# Patient Record
Sex: Male | Born: 1955
Health system: Southern US, Community
[De-identification: ages and names within clinical notes are randomized; demographics above are authoritative.]

## PROBLEM LIST (undated history)

## (undated) DIAGNOSIS — N529 Male erectile dysfunction, unspecified: Secondary | ICD-10-CM

## (undated) DIAGNOSIS — Z888 Allergy status to other drugs, medicaments and biological substances status: Secondary | ICD-10-CM

## (undated) DIAGNOSIS — J309 Allergic rhinitis, unspecified: Secondary | ICD-10-CM

## (undated) DIAGNOSIS — Z8679 Personal history of other diseases of the circulatory system: Secondary | ICD-10-CM

## (undated) DIAGNOSIS — I4891 Unspecified atrial fibrillation: Secondary | ICD-10-CM

## (undated) DIAGNOSIS — F419 Anxiety disorder, unspecified: Secondary | ICD-10-CM

## (undated) DIAGNOSIS — H9319 Tinnitus, unspecified ear: Secondary | ICD-10-CM

## (undated) DIAGNOSIS — R202 Paresthesia of skin: Secondary | ICD-10-CM

## (undated) DIAGNOSIS — F329 Major depressive disorder, single episode, unspecified: Secondary | ICD-10-CM

## (undated) DIAGNOSIS — E785 Hyperlipidemia, unspecified: Secondary | ICD-10-CM

## (undated) DIAGNOSIS — I428 Other cardiomyopathies: Secondary | ICD-10-CM

## (undated) DIAGNOSIS — R079 Chest pain, unspecified: Secondary | ICD-10-CM

## (undated) DIAGNOSIS — I10 Essential (primary) hypertension: Secondary | ICD-10-CM

## (undated) DIAGNOSIS — N200 Calculus of kidney: Secondary | ICD-10-CM

## (undated) DIAGNOSIS — J019 Acute sinusitis, unspecified: Secondary | ICD-10-CM

## (undated) HISTORY — DX: Male erectile dysfunction, unspecified: N52.9

## (undated) HISTORY — DX: Personal history of other diseases of the circulatory system: Z86.79

## (undated) HISTORY — DX: Anxiety disorder, unspecified: F41.9

## (undated) HISTORY — DX: Allergy status to other drugs, medicaments and biological substances status: Z88.8

## (undated) HISTORY — DX: Acute sinusitis, unspecified: J01.90

## (undated) HISTORY — DX: Allergic rhinitis, unspecified: J30.9

## (undated) HISTORY — DX: Other cardiomyopathies: I42.8

## (undated) HISTORY — DX: Tinnitus, unspecified ear: H93.19

## (undated) HISTORY — DX: Hyperlipidemia, unspecified: E78.5

## (undated) HISTORY — DX: Paresthesia of skin: R20.2

## (undated) HISTORY — DX: Calculus of kidney: N20.0

## (undated) HISTORY — DX: Essential (primary) hypertension: I10

## (undated) HISTORY — DX: Major depressive disorder, single episode, unspecified: F32.9

## (undated) HISTORY — DX: Chest pain, unspecified: R07.9

## (undated) HISTORY — DX: Unspecified atrial fibrillation: I48.91

---

## 2002-02-13 ENCOUNTER — Ambulatory Visit (HOSPITAL_COMMUNITY): Admission: RE | Admit: 2002-02-13 | Discharge: 2002-02-13 | Payer: Self-pay | Admitting: Chiropractor

## 2002-02-13 ENCOUNTER — Encounter: Payer: Self-pay | Admitting: Chiropractor

## 2004-05-27 ENCOUNTER — Ambulatory Visit (HOSPITAL_COMMUNITY): Admission: RE | Admit: 2004-05-27 | Discharge: 2004-05-27 | Payer: Self-pay | Admitting: Orthopedic Surgery

## 2004-05-27 ENCOUNTER — Ambulatory Visit (HOSPITAL_BASED_OUTPATIENT_CLINIC_OR_DEPARTMENT_OTHER): Admission: RE | Admit: 2004-05-27 | Discharge: 2004-05-27 | Payer: Self-pay | Admitting: Orthopedic Surgery

## 2004-05-27 ENCOUNTER — Encounter (INDEPENDENT_AMBULATORY_CARE_PROVIDER_SITE_OTHER): Payer: Self-pay | Admitting: *Deleted

## 2005-01-03 ENCOUNTER — Ambulatory Visit: Payer: Self-pay | Admitting: Family Medicine

## 2005-01-04 ENCOUNTER — Ambulatory Visit: Payer: Self-pay | Admitting: Family Medicine

## 2005-05-11 ENCOUNTER — Ambulatory Visit: Payer: Self-pay | Admitting: Family Medicine

## 2005-07-24 ENCOUNTER — Ambulatory Visit: Payer: Self-pay | Admitting: Family Medicine

## 2005-11-23 ENCOUNTER — Ambulatory Visit: Payer: Self-pay | Admitting: Family Medicine

## 2006-03-14 ENCOUNTER — Ambulatory Visit: Payer: Self-pay | Admitting: Family Medicine

## 2006-03-29 ENCOUNTER — Encounter: Admission: RE | Admit: 2006-03-29 | Discharge: 2006-03-29 | Payer: Self-pay | Admitting: *Deleted

## 2006-09-05 ENCOUNTER — Ambulatory Visit: Payer: Self-pay | Admitting: Family Medicine

## 2007-03-05 DIAGNOSIS — N200 Calculus of kidney: Secondary | ICD-10-CM | POA: Insufficient documentation

## 2007-03-05 DIAGNOSIS — E785 Hyperlipidemia, unspecified: Secondary | ICD-10-CM | POA: Insufficient documentation

## 2007-03-05 DIAGNOSIS — J309 Allergic rhinitis, unspecified: Secondary | ICD-10-CM

## 2007-03-05 HISTORY — DX: Calculus of kidney: N20.0

## 2007-03-05 HISTORY — DX: Hyperlipidemia, unspecified: E78.5

## 2007-03-05 HISTORY — DX: Allergic rhinitis, unspecified: J30.9

## 2007-04-25 ENCOUNTER — Telehealth: Payer: Self-pay | Admitting: Family Medicine

## 2007-04-25 ENCOUNTER — Ambulatory Visit: Payer: Self-pay | Admitting: Family Medicine

## 2007-04-25 DIAGNOSIS — F3289 Other specified depressive episodes: Secondary | ICD-10-CM

## 2007-04-25 DIAGNOSIS — F329 Major depressive disorder, single episode, unspecified: Secondary | ICD-10-CM | POA: Insufficient documentation

## 2007-04-25 DIAGNOSIS — Z8679 Personal history of other diseases of the circulatory system: Secondary | ICD-10-CM | POA: Insufficient documentation

## 2007-04-25 DIAGNOSIS — J019 Acute sinusitis, unspecified: Secondary | ICD-10-CM | POA: Insufficient documentation

## 2007-04-25 HISTORY — DX: Personal history of other diseases of the circulatory system: Z86.79

## 2007-04-25 HISTORY — DX: Acute sinusitis, unspecified: J01.90

## 2007-04-25 HISTORY — DX: Major depressive disorder, single episode, unspecified: F32.9

## 2007-04-25 HISTORY — DX: Other specified depressive episodes: F32.89

## 2007-04-26 ENCOUNTER — Telehealth (INDEPENDENT_AMBULATORY_CARE_PROVIDER_SITE_OTHER): Payer: Self-pay | Admitting: *Deleted

## 2007-04-26 ENCOUNTER — Ambulatory Visit: Payer: Self-pay | Admitting: Family Medicine

## 2007-04-27 ENCOUNTER — Ambulatory Visit: Payer: Self-pay | Admitting: Family Medicine

## 2007-04-27 ENCOUNTER — Encounter: Payer: Self-pay | Admitting: Family Medicine

## 2007-04-30 LAB — CONVERTED CEMR LAB
ALT: 36 units/L (ref 0–40)
AST: 23 units/L (ref 0–37)
Direct LDL: 135 mg/dL
HDL: 41.9 mg/dL (ref >39.0)
Testosterone Free: 111.5 pg/mL (ref 47.0–244.0)

## 2007-07-31 ENCOUNTER — Ambulatory Visit: Payer: Self-pay | Admitting: Family Medicine

## 2007-08-06 ENCOUNTER — Encounter (INDEPENDENT_AMBULATORY_CARE_PROVIDER_SITE_OTHER): Payer: Self-pay | Admitting: *Deleted

## 2007-08-06 LAB — CONVERTED CEMR LAB
ALT: 40 units/L (ref 0–53)
AST: 30 units/L (ref 0–37)
HDL: 39.9 mg/dL (ref >39.0)
Total Bilirubin: 0.9 mg/dL (ref 0.3–1.2)
Triglycerides: 184 mg/dL — ABNORMAL HIGH (ref 0–149)

## 2007-11-18 ENCOUNTER — Telehealth (INDEPENDENT_AMBULATORY_CARE_PROVIDER_SITE_OTHER): Payer: Self-pay | Admitting: *Deleted

## 2008-01-16 ENCOUNTER — Ambulatory Visit: Payer: Self-pay | Admitting: Family Medicine

## 2008-01-27 LAB — CONVERTED CEMR LAB
Alkaline Phosphatase: 61 units/L (ref 39–117)
Bilirubin, Direct: 0.1 mg/dL (ref 0.0–0.3)
Cholesterol: 175 mg/dL (ref 0–200)
Total Bilirubin: 0.9 mg/dL (ref 0.3–1.2)
Total CHOL/HDL Ratio: 3.8
Total Protein: 7.4 g/dL (ref 6.0–8.3)
Triglycerides: 89 mg/dL (ref 0–149)
VLDL: 18 mg/dL (ref 0–40)

## 2008-01-30 ENCOUNTER — Telehealth (INDEPENDENT_AMBULATORY_CARE_PROVIDER_SITE_OTHER): Payer: Self-pay | Admitting: *Deleted

## 2008-01-31 ENCOUNTER — Ambulatory Visit: Payer: Self-pay | Admitting: Family Medicine

## 2008-02-06 ENCOUNTER — Telehealth (INDEPENDENT_AMBULATORY_CARE_PROVIDER_SITE_OTHER): Payer: Self-pay | Admitting: *Deleted

## 2008-07-27 ENCOUNTER — Ambulatory Visit: Payer: Self-pay | Admitting: Family Medicine

## 2008-08-10 ENCOUNTER — Encounter (INDEPENDENT_AMBULATORY_CARE_PROVIDER_SITE_OTHER): Payer: Self-pay | Admitting: *Deleted

## 2008-08-10 LAB — CONVERTED CEMR LAB
ALT: 30 units/L (ref 0–53)
Albumin: 4.2 g/dL (ref 3.5–5.2)
Alkaline Phosphatase: 51 units/L (ref 39–117)
Cholesterol: 186 mg/dL (ref 0–200)
Total Bilirubin: 1.1 mg/dL (ref 0.3–1.2)
Triglycerides: 129 mg/dL (ref 0–149)

## 2008-10-01 ENCOUNTER — Ambulatory Visit: Payer: Self-pay | Admitting: Family Medicine

## 2008-10-01 DIAGNOSIS — I10 Essential (primary) hypertension: Secondary | ICD-10-CM

## 2008-10-01 DIAGNOSIS — R0789 Other chest pain: Secondary | ICD-10-CM | POA: Insufficient documentation

## 2008-10-01 DIAGNOSIS — R079 Chest pain, unspecified: Secondary | ICD-10-CM

## 2008-10-01 HISTORY — DX: Chest pain, unspecified: R07.9

## 2008-10-01 HISTORY — DX: Essential (primary) hypertension: I10

## 2008-10-02 ENCOUNTER — Encounter (INDEPENDENT_AMBULATORY_CARE_PROVIDER_SITE_OTHER): Payer: Self-pay | Admitting: *Deleted

## 2008-10-13 ENCOUNTER — Encounter: Payer: Self-pay | Admitting: Family Medicine

## 2008-10-14 ENCOUNTER — Ambulatory Visit: Payer: Self-pay

## 2008-10-15 ENCOUNTER — Ambulatory Visit: Payer: Self-pay | Admitting: Family Medicine

## 2008-10-22 ENCOUNTER — Encounter (INDEPENDENT_AMBULATORY_CARE_PROVIDER_SITE_OTHER): Payer: Self-pay | Admitting: *Deleted

## 2008-10-22 ENCOUNTER — Telehealth (INDEPENDENT_AMBULATORY_CARE_PROVIDER_SITE_OTHER): Payer: Self-pay | Admitting: *Deleted

## 2009-01-18 ENCOUNTER — Ambulatory Visit: Payer: Self-pay | Admitting: Family Medicine

## 2009-01-18 DIAGNOSIS — Z888 Allergy status to other drugs, medicaments and biological substances status: Secondary | ICD-10-CM

## 2009-01-18 HISTORY — DX: Allergy status to other drugs, medicaments and biological substances: Z88.8

## 2009-02-01 ENCOUNTER — Ambulatory Visit: Payer: Self-pay | Admitting: Family Medicine

## 2009-02-20 LAB — CONVERTED CEMR LAB
ALT: 27 units/L (ref 0–53)
CO2: 29 meq/L (ref 19–32)
Calcium: 9.2 mg/dL (ref 8.4–10.5)
Direct LDL: 193.9 mg/dL
GFR calc non Af Amer: 61.24 mL/min (ref >60)
Glucose, Bld: 99 mg/dL (ref 70–99)
Potassium: 4.3 meq/L (ref 3.5–5.1)
Total CHOL/HDL Ratio: 6
Triglycerides: 122 mg/dL (ref 0.0–149.0)
VLDL: 24.4 mg/dL (ref 0.0–40.0)

## 2009-02-23 ENCOUNTER — Telehealth: Payer: Self-pay | Admitting: Family Medicine

## 2010-02-27 ENCOUNTER — Ambulatory Visit: Payer: Self-pay | Admitting: Pulmonary Disease

## 2010-02-27 ENCOUNTER — Inpatient Hospital Stay (HOSPITAL_COMMUNITY): Admission: EM | Admit: 2010-02-27 | Discharge: 2010-03-22 | Payer: Self-pay | Admitting: Emergency Medicine

## 2010-02-28 ENCOUNTER — Ambulatory Visit: Payer: Self-pay | Admitting: Cardiology

## 2010-03-01 ENCOUNTER — Encounter: Payer: Self-pay | Admitting: Pulmonary Disease

## 2010-03-02 ENCOUNTER — Encounter: Payer: Self-pay | Admitting: Pulmonary Disease

## 2010-03-04 ENCOUNTER — Ambulatory Visit: Payer: Self-pay | Admitting: Internal Medicine

## 2010-03-07 ENCOUNTER — Encounter: Payer: Self-pay | Admitting: Critical Care Medicine

## 2010-03-07 ENCOUNTER — Encounter: Payer: Self-pay | Admitting: Cardiology

## 2010-03-14 ENCOUNTER — Encounter (INDEPENDENT_AMBULATORY_CARE_PROVIDER_SITE_OTHER): Payer: Self-pay | Admitting: Pulmonary Disease

## 2010-03-14 ENCOUNTER — Ambulatory Visit: Payer: Self-pay | Admitting: Vascular Surgery

## 2010-03-18 ENCOUNTER — Ambulatory Visit: Payer: Self-pay | Admitting: Physical Medicine & Rehabilitation

## 2010-03-22 ENCOUNTER — Inpatient Hospital Stay (HOSPITAL_COMMUNITY)
Admission: RE | Admit: 2010-03-22 | Discharge: 2010-03-30 | Payer: Self-pay | Admitting: Physical Medicine & Rehabilitation

## 2010-03-26 ENCOUNTER — Ambulatory Visit: Payer: Self-pay | Admitting: Physical Medicine & Rehabilitation

## 2010-03-30 ENCOUNTER — Encounter: Payer: Self-pay | Admitting: Physical Medicine & Rehabilitation

## 2010-04-04 ENCOUNTER — Encounter
Admission: RE | Admit: 2010-04-04 | Discharge: 2010-04-18 | Payer: Self-pay | Admitting: Physical Medicine & Rehabilitation

## 2010-04-12 ENCOUNTER — Encounter
Admission: RE | Admit: 2010-04-12 | Discharge: 2010-04-18 | Payer: Self-pay | Admitting: Physical Medicine & Rehabilitation

## 2010-04-28 ENCOUNTER — Encounter: Payer: Self-pay | Admitting: Cardiology

## 2010-04-28 DIAGNOSIS — I4891 Unspecified atrial fibrillation: Secondary | ICD-10-CM

## 2010-04-28 HISTORY — DX: Unspecified atrial fibrillation: I48.91

## 2010-04-29 ENCOUNTER — Ambulatory Visit: Payer: Self-pay | Admitting: Cardiology

## 2010-07-29 ENCOUNTER — Ambulatory Visit: Payer: Self-pay | Admitting: Cardiology

## 2010-07-29 DIAGNOSIS — I428 Other cardiomyopathies: Secondary | ICD-10-CM

## 2010-07-29 HISTORY — DX: Other cardiomyopathies: I42.8

## 2010-08-08 ENCOUNTER — Encounter: Payer: Self-pay | Admitting: Family Medicine

## 2010-08-30 ENCOUNTER — Encounter: Payer: Self-pay | Admitting: Internal Medicine

## 2010-08-30 ENCOUNTER — Encounter: Payer: Self-pay | Admitting: Cardiology

## 2010-08-30 ENCOUNTER — Ambulatory Visit: Payer: Self-pay

## 2010-08-30 ENCOUNTER — Ambulatory Visit: Payer: Self-pay | Admitting: Cardiology

## 2010-08-30 ENCOUNTER — Ambulatory Visit (HOSPITAL_COMMUNITY): Admission: RE | Admit: 2010-08-30 | Discharge: 2010-08-30 | Payer: Self-pay | Admitting: Cardiology

## 2010-10-01 ENCOUNTER — Ambulatory Visit: Payer: Self-pay | Admitting: Oncology

## 2010-10-02 ENCOUNTER — Inpatient Hospital Stay (HOSPITAL_COMMUNITY): Admission: EM | Admit: 2010-10-02 | Discharge: 2010-10-04 | Payer: Self-pay | Admitting: Emergency Medicine

## 2010-10-04 ENCOUNTER — Ambulatory Visit: Payer: Self-pay | Admitting: Oncology

## 2010-10-06 ENCOUNTER — Emergency Department (HOSPITAL_COMMUNITY): Admission: EM | Admit: 2010-10-06 | Discharge: 2010-10-06 | Payer: Self-pay | Admitting: Emergency Medicine

## 2010-10-07 ENCOUNTER — Telehealth: Payer: Self-pay | Admitting: Cardiology

## 2010-10-13 ENCOUNTER — Encounter: Payer: Self-pay | Admitting: Cardiology

## 2010-10-13 LAB — CBC WITH DIFFERENTIAL/PLATELET
BASO%: 0.4 % (ref 0.0–2.0)
Basophils Absolute: 0 10*3/uL (ref 0.0–0.1)
EOS%: 0.7 % (ref 0.0–7.0)
Eosinophils Absolute: 0 10*3/uL (ref 0.0–0.5)
LYMPH%: 38.2 % (ref 14.0–49.0)
MCH: 33.8 pg — ABNORMAL HIGH (ref 27.2–33.4)
MCHC: 34.7 g/dL (ref 32.0–36.0)
MONO#: 0.5 10*3/uL (ref 0.1–0.9)
MONO%: 6.9 % (ref 0.0–14.0)
NEUT%: 53.8 % (ref 39.0–75.0)
WBC: 6.8 10*3/uL (ref 4.0–10.3)

## 2010-10-20 ENCOUNTER — Encounter: Payer: Self-pay | Admitting: Cardiology

## 2010-10-20 LAB — CBC WITH DIFFERENTIAL/PLATELET
Basophils Absolute: 0 10*3/uL (ref 0.0–0.1)
MCHC: 34 g/dL (ref 32.0–36.0)
MONO#: 0.5 10*3/uL (ref 0.1–0.9)
MONO%: 13.6 % (ref 0.0–14.0)
NEUT#: 1.8 10*3/uL (ref 1.5–6.5)
RBC: 3.36 10*6/uL — ABNORMAL LOW (ref 4.20–5.82)
WBC: 3.8 10*3/uL — ABNORMAL LOW (ref 4.0–10.3)
nRBC: 1 % — ABNORMAL HIGH (ref 0–0)

## 2010-10-24 LAB — CBC WITH DIFFERENTIAL/PLATELET
Basophils Absolute: 0 10*3/uL (ref 0.0–0.1)
Eosinophils Absolute: 0.1 10*3/uL (ref 0.0–0.5)
HCT: 35.1 % — ABNORMAL LOW (ref 38.4–49.9)
HGB: 12 g/dL — ABNORMAL LOW (ref 13.0–17.1)
LYMPH%: 49.9 % — ABNORMAL HIGH (ref 14.0–49.0)
MCV: 97.2 fL (ref 79.3–98.0)
MONO#: 0.2 10*3/uL (ref 0.1–0.9)
MONO%: 6 % (ref 0.0–14.0)
NEUT#: 1.5 10*3/uL (ref 1.5–6.5)
Platelets: 120 10*3/uL — ABNORMAL LOW (ref 140–400)
RBC: 3.61 10*6/uL — ABNORMAL LOW (ref 4.20–5.82)
WBC: 3.5 10*3/uL — ABNORMAL LOW (ref 4.0–10.3)

## 2010-11-03 ENCOUNTER — Ambulatory Visit: Payer: Self-pay | Admitting: Oncology

## 2010-11-08 ENCOUNTER — Encounter (HOSPITAL_COMMUNITY)
Admission: RE | Admit: 2010-11-08 | Discharge: 2010-11-11 | Payer: Self-pay | Source: Home / Self Care | Attending: Oncology | Admitting: Oncology

## 2010-11-08 LAB — BASIC METABOLIC PANEL
CO2: 24 mEq/L (ref 19–32)
Calcium: 9.1 mg/dL (ref 8.4–10.5)
Creatinine, Ser: 1.21 mg/dL (ref 0.40–1.50)
Glucose, Bld: 97 mg/dL (ref 70–99)

## 2010-11-08 LAB — CBC WITH DIFFERENTIAL/PLATELET
BASO%: 0.6 % (ref 0.0–2.0)
Eosinophils Absolute: 0.1 10*3/uL (ref 0.0–0.5)
HCT: 38.7 % (ref 38.4–49.9)
LYMPH%: 41.6 % (ref 14.0–49.0)
MCHC: 35.5 g/dL (ref 32.0–36.0)
MCV: 95.4 fL (ref 79.3–98.0)
MONO#: 0.2 10*3/uL (ref 0.1–0.9)
MONO%: 5.5 % (ref 0.0–14.0)
NEUT%: 50.8 % (ref 39.0–75.0)
Platelets: 72 10*3/uL — ABNORMAL LOW (ref 140–400)
RBC: 4.06 10*6/uL — ABNORMAL LOW (ref 4.20–5.82)
WBC: 4.3 10*3/uL (ref 4.0–10.3)

## 2010-11-09 ENCOUNTER — Ambulatory Visit
Admission: RE | Admit: 2010-11-09 | Discharge: 2010-11-09 | Payer: Self-pay | Source: Home / Self Care | Attending: Orthopedic Surgery | Admitting: Orthopedic Surgery

## 2010-11-16 ENCOUNTER — Encounter: Payer: Self-pay | Admitting: Cardiology

## 2010-11-17 LAB — CBC WITH DIFFERENTIAL/PLATELET
BASO%: 0.5 % (ref 0.0–2.0)
Basophils Absolute: 0 10*3/uL (ref 0.0–0.1)
EOS%: 1.2 % (ref 0.0–7.0)
Eosinophils Absolute: 0.1 10*3/uL (ref 0.0–0.5)
HCT: 40.9 % (ref 38.4–49.9)
HGB: 14.5 g/dL (ref 13.0–17.1)
LYMPH%: 32.3 % (ref 14.0–49.0)
MCH: 32.4 pg (ref 27.2–33.4)
MCHC: 35.5 g/dL (ref 32.0–36.0)
MCV: 91.3 fL (ref 79.3–98.0)
MONO#: 0.6 10*3/uL (ref 0.1–0.9)
MONO%: 10.1 % (ref 0.0–14.0)
NEUT#: 3.2 10*3/uL (ref 1.5–6.5)
NEUT%: 55.9 % (ref 39.0–75.0)
Platelets: 67 10*3/uL — ABNORMAL LOW (ref 140–400)
RBC: 4.48 10*6/uL (ref 4.20–5.82)
RDW: 13 % (ref 11.0–14.6)
WBC: 5.8 10*3/uL (ref 4.0–10.3)
lymph#: 1.9 10*3/uL (ref 0.9–3.3)
nRBC: 0 % (ref 0–0)

## 2010-11-25 LAB — CBC WITH DIFFERENTIAL/PLATELET
BASO%: 0.3 % (ref 0.0–2.0)
Basophils Absolute: 0 10*3/uL (ref 0.0–0.1)
EOS%: 1 % (ref 0.0–7.0)
Eosinophils Absolute: 0.1 10*3/uL (ref 0.0–0.5)
HCT: 42.3 % (ref 38.4–49.9)
HGB: 15 g/dL (ref 13.0–17.1)
LYMPH%: 42.8 % (ref 14.0–49.0)
MCH: 32.5 pg (ref 27.2–33.4)
MCHC: 35.5 g/dL (ref 32.0–36.0)
MCV: 91.6 fL (ref 79.3–98.0)
MONO#: 0.5 10*3/uL (ref 0.1–0.9)
MONO%: 7.9 % (ref 0.0–14.0)
NEUT#: 3 10*3/uL (ref 1.5–6.5)
NEUT%: 48 % (ref 39.0–75.0)
Platelets: 122 10*3/uL — ABNORMAL LOW (ref 140–400)
RBC: 4.62 10*6/uL (ref 4.20–5.82)
RDW: 12.8 % (ref 11.0–14.6)
WBC: 6.2 10*3/uL (ref 4.0–10.3)
lymph#: 2.7 10*3/uL (ref 0.9–3.3)

## 2010-12-02 LAB — CBC WITH DIFFERENTIAL/PLATELET
BASO%: 0.4 % (ref 0.0–2.0)
Basophils Absolute: 0 10*3/uL (ref 0.0–0.1)
EOS%: 1.3 % (ref 0.0–7.0)
Eosinophils Absolute: 0.1 10*3/uL (ref 0.0–0.5)
HCT: 42.5 % (ref 38.4–49.9)
HGB: 14.8 g/dL (ref 13.0–17.1)
LYMPH%: 32.9 % (ref 14.0–49.0)
MCH: 31.9 pg (ref 27.2–33.4)
MCHC: 34.8 g/dL (ref 32.0–36.0)
MCV: 91.6 fL (ref 79.3–98.0)
MONO#: 0.4 10*3/uL (ref 0.1–0.9)
MONO%: 7.3 % (ref 0.0–14.0)
NEUT#: 3.2 10*3/uL (ref 1.5–6.5)
NEUT%: 58.1 % (ref 39.0–75.0)
Platelets: 81 10*3/uL — ABNORMAL LOW (ref 140–400)
RBC: 4.64 10*6/uL (ref 4.20–5.82)
RDW: 12.6 % (ref 11.0–14.6)
WBC: 5.5 10*3/uL (ref 4.0–10.3)
lymph#: 1.8 10*3/uL (ref 0.9–3.3)

## 2010-12-07 ENCOUNTER — Ambulatory Visit (HOSPITAL_BASED_OUTPATIENT_CLINIC_OR_DEPARTMENT_OTHER): Payer: BC Managed Care – PPO | Admitting: Oncology

## 2010-12-08 NOTE — Op Note (Addendum)
  [name], Richard Scott                ACCOUNT NO.:  [id number]  MEDICAL RECORD NO.:  [id number]          PATIENT TYPE:  AMB  LOCATION:  DSC                          FACILITY:  [facility]  PHYSICIAN:  Artist Pais. Weingold, M.D.DATE OF BIRTH:  03-09-1956  DATE OF PROCEDURE: DATE OF DISCHARGE:                              OPERATIVE REPORT   PREOPERATIVE DIAGNOSIS:  Web space contracture to the left hand, thumb index finger.  POSTOPERATIVE DIAGNOSIS:  Web space contracture to the left hand, thumb index finger.  PROCEDURE:  Release of contracted first dorsal interosseous muscle and abductor muscle, webspace thumb index left hand with rotational flap and full-thickness skin grafting.  SURGEON:  Artist Pais. Mina Marble, MD. Dr. Merlyn Lot  ANESTHESIA:  General.  TOURNIQUET TIME:  50 minutes.  COMPLICATIONS:  No complication.  DRAINS:  No drains.  OPERATIVE REPORT:  The patient was taken to the operating suite.  After induction of adequate general anesthesia, left upper extremity is prepped and draped in sterile fashion.  An Esmarch was used to exsanguinate the limb.  Tourniquet was then inflated to 215 mmHg.  At this point in time, incision was made in the webspace of the left hand starting at the mid metacarpal level both the index and thumb going into the webspace longitudinally.  Care was taken to identify and retract neurovascular bundles.  The contracted first dorsal interosseous muscle and thumb abductor were carefully released off the index metacarpal. The webspace was deepened and increased by this contracture release.  At this point in time, a rotational flap was developed from the index finger dorsally, raised to fill the defect and sutured using 4-0 Vicryl Rapide sutures.  After this was done, a full-thickness skin graft was then obtained from the palmar aspect of the hand and the wrist area on the left side, elliptical nature.  This was raised and then used to fill the defect with  rotational flap was initiated.  This was sewn with 4-0 Vicryl Rapide at the tips of the flap followed by running 6-0 chromic suture.  The donor site was closed with 4-0 Vicryl Rapide horizontal mattress sutures x4.  All injection sites were carefully infiltrated with course on plain Marcaine.  The patient was placed in sterile dressing. Xeroform, 4x4s fluffs and a splint with the thumb web space maximally abducted.  The patient tolerated the procedure well and went to recovery room in stable fashion.     Artist Pais Mina Marble, M.D.     MAW/MEDQ  D:  11/09/2010  T:  11/10/2010  Job:  [id number]  Electronically Signed by Dairl Ponder M.D. on 12/08/2010 07:47:27 PM

## 2010-12-09 LAB — CBC WITH DIFFERENTIAL/PLATELET
BASO%: 0.4 % (ref 0.0–2.0)
EOS%: 1.8 % (ref 0.0–7.0)
HCT: 40.2 % (ref 38.4–49.9)
HGB: 14.2 g/dL (ref 13.0–17.1)
MCH: 32.6 pg (ref 27.2–33.4)
MCHC: 35.4 g/dL (ref 32.0–36.0)
MONO%: 6.1 % (ref 0.0–14.0)
NEUT#: 2.7 10*3/uL (ref 1.5–6.5)
Platelets: 76 10*3/uL — ABNORMAL LOW (ref 140–400)
RBC: 4.36 10*6/uL (ref 4.20–5.82)
RDW: 13.1 % (ref 11.0–14.6)
WBC: 4.6 10*3/uL (ref 4.0–10.3)

## 2010-12-11 LAB — CONVERTED CEMR LAB
ALT: 36 units/L (ref 0–53)
Albumin: 4.1 g/dL (ref 3.5–5.2)
Alkaline Phosphatase: 47 units/L (ref 39–117)
Basophils Absolute: 0 10*3/uL (ref 0.0–0.1)
Basophils Relative: 0.1 % (ref 0.0–3.0)
CK-MB: 1.7 ng/mL (ref 0.3–4.0)
CO2: 28 meq/L (ref 19–32)
Chloride: 104 meq/L (ref 96–112)
Direct LDL: 94.9 mg/dL
Eosinophils Absolute: 0.1 10*3/uL (ref 0.0–0.7)
Eosinophils Relative: 1.9 % (ref 0.0–5.0)
GFR calc Af Amer: 81 mL/min
HDL: 52.7 mg/dL (ref >39.0)
Lymphocytes Relative: 38.2 % (ref 12.0–46.0)
Monocytes Relative: 3.3 % (ref 3.0–12.0)
Platelets: 161 10*3/uL (ref 150–400)
RDW: 12 % (ref 11.5–14.6)
Total Bilirubin: 0.7 mg/dL (ref 0.3–1.2)
Total CHOL/HDL Ratio: 3.5
Total CK: 73 units/L (ref 7–195)
Triglycerides: 270 mg/dL (ref 0–149)
WBC: 6 10*3/uL (ref 4.5–10.5)

## 2010-12-12 ENCOUNTER — Encounter: Payer: Self-pay | Admitting: Cardiology

## 2010-12-12 LAB — CBC WITH DIFFERENTIAL/PLATELET
Basophils Absolute: 0 10*3/uL (ref 0.0–0.1)
EOS%: 0.5 % (ref 0.0–7.0)
Eosinophils Absolute: 0 10*3/uL (ref 0.0–0.5)
HCT: 40.5 % (ref 38.4–49.9)
MONO#: 0.6 10*3/uL (ref 0.1–0.9)
MONO%: 10.7 % (ref 0.0–14.0)
NEUT#: 2.8 10*3/uL (ref 1.5–6.5)
NEUT%: 49 % (ref 39.0–75.0)
Platelets: 111 10*3/uL — ABNORMAL LOW (ref 140–400)
RBC: 4.48 10*6/uL (ref 4.20–5.82)
RDW: 12.5 % (ref 11.0–14.6)
WBC: 5.6 10*3/uL (ref 4.0–10.3)
lymph#: 2.2 10*3/uL (ref 0.9–3.3)

## 2010-12-13 NOTE — Progress Notes (Signed)
Summary: feeling lightheaded and passed out yesterday   Phone Note Call from Patient Call back at Home Phone [phone]   Caller: Patient Summary of Call: Pt feeling light headed passed out twice yesterday Initial call taken by: Judie Grieve,  October 07, 2010 12:38 PM  Follow-up for Phone Call        10/07/10--1300pm--pt. calling c/o being liteheaded since d/c from [facility]--states passed out in kitchen last noc--BP=123/73 this am --pulse 92 and seems regular(per wife)--pt has history of thrombocytopenia and has f/u appoint with dr Trish Fountain speak with dr Myrtis Ser and phone pt back--pt agrees--nt 1310--10/07/10--spoke with dr Myrtis Ser who states pt should go to ed to have blood work done 1315--10/07/10--returned call to pt and advised him to go to [place] for lab work(per dr Myrtis Ser) --pt states he does not want to go back to [facility] and feels his symptoms are due to IVIG-he states he will f/u with dr Darnelle Catalan this coming week--nt Follow-up by: Ledon Snare, RN,  October 07, 2010 1:27 PM

## 2010-12-13 NOTE — Miscellaneous (Signed)
  Clinical Lists Changes  Problems: Added new problem of * SEPSIS /RENAL FAILURE /RESPIRATORY FAILURE / APRIL, 2011 Added new problem of ATRIAL FIBRILLATION, PAROXYSMAL (ICD-427.31) Observations: Added new observation of PAST MED HX: Allergic rhinitis Hyperlipidemia Hypertension Septic shock/ pneumonia / vent-dependent respiratory failure / multiorgan dysfunction with renal failure / M[facility].Marland Kitchen February 28, 2010-Mar 22, 2010. Troponin elevation with LV dysfunction... at the time of septic shock... April, 2011... septic cardiomyopathy and paroxysmal atrial fib... improved during hospitalization Ischemic left thumb... at time of septic shock EF 40%... echo... sepsis syndrome... March 01, 2010  /  EF 60%... echo... 25th, 2011... sepsis improved... LV improved... no focal wall motion abnormalities Atrial fibrillation...mch... April, 2011.... limited to septic syndrome (04/28/2010 10:39) Added new observation of PRIMARY MD: lowne (04/28/2010 10:39)       Past History:  Past Medical History: Allergic rhinitis Hyperlipidemia Hypertension Septic shock/ pneumonia / vent-dependent respiratory failure / multiorgan dysfunction with renal failure / [facility].Marland Kitchen February 28, 2010-Mar 22, 2010. Troponin elevation with LV dysfunction... at the time of septic shock... April, 2011... septic cardiomyopathy and paroxysmal atrial fib... improved during hospitalization Ischemic left thumb... at time of septic shock EF 40%... echo... sepsis syndrome... March 01, 2010  /  EF 60%... echo... 25th, 2011... sepsis improved... LV improved... no focal wall motion abnormalities Atrial fibrillation...mch... April, 2011.... limited to septic syndrome

## 2010-12-13 NOTE — Miscellaneous (Signed)
Summary: Appointment Canceled  Appointment status changed to canceled by [employer] on 08/08/2010 4:41 PM.  Cancellation Comments --------------------- stress echo/427.31/bcbs prec. req/saf  Appointment Information ----------------------- Appt Type:  CARDIOLOGY ANCILLARY VISIT      Date:  Tuesday, August 16, 2010      Time:  2:00 PM for 60 min   Urgency:  Routine   Made By:  Pearson Grippe  To Visit:  [id number]    Reason:  stress echo/427.31/bcbs prec. req/saf  Appt Comments ------------- -- 08/08/10 16:41: ([facility]) CANCELED -- stress echo/427.31/bcbs prec. req/saf -- 07/29/10 9:35: ([facility]) BOOKED -- Routine CARDIOLOGY ANCILLARY VISIT at 08/16/2010 2:00 PM for 60 min stress echo/427.31/bcbs prec. req/saf

## 2010-12-13 NOTE — Assessment & Plan Note (Signed)
Summary: 3 month rov.sl  Medications Added SIMVASTATIN 80 MG TABS (SIMVASTATIN) Take one tablet by mouth daily at bedtime TRAMADOL HCL 50 MG TABS (TRAMADOL HCL) as needed METOPROLOL TARTRATE 25 MG TABS (METOPROLOL TARTRATE) Take one tablet by mouth twice a day        Visit Type:  Follow-up Primary Provider:  Abe People, MD  CC:  cardiac followup.  History of Present Illness: The patient is seen for cardiology followup.  I saw him last in the office April 29, 2010.  In that note I outlined the fact that the patient had a prolonged hospitalization during which time he was critically ill in April, 2011.  He had a sepsis syndrome and some left ventricular dysfunction at that time.  LV function improved.  He's done well.  He has gained some weight.  He's not having chest pain or shortness of breath.  He's had no palpitations.  Current Medications (verified): 1)  Adult Aspirin Low Strength 81 Mg  Tbdp (Aspirin) .Marland Kitchen.. 1qd 2)  Glucosamine Complex   Tabs (Nutritional Supplements) .Marland Kitchen.. 1 Once Daily 3)  Tricor 145 Mg Tabs (Fenofibrate) .Marland Kitchen.. 1 By Mouth Once Daily 4)  Simvastatin 80 Mg Tabs (Simvastatin) .... Take One Tablet By Mouth Daily At Bedtime 5)  Tramadol Hcl 50 Mg Tabs (Tramadol Hcl) .... As Needed 6)  Metoprolol Tartrate 25 Mg Tabs (Metoprolol Tartrate) .... Take One Tablet By Mouth Twice A Day  Allergies: 1)  ! Morphine 2)  ! Darvon 3)  ! Codeine 4)  ! Demerol 5)  ! * Tetanus 6)  ! * Sulfa Drugs  Past History:  Past Medical History: Hyperlipidemia  Hypertensio  Ischemic left thumb with planned debridement per Dr. Mina Marble on   Mar 30, 2010.  Acute renal failure, resolved.   Atrial fibrillation.   Dysphagia.  Anemia.  Septic shock/ pneumonia / vent-dependent respiratory failure / multiorgan dysfunction with renal failure / [facility].Marland Kitchen February 28, 2010-Mar 22, 2010. Troponin elevation with LV dysfunction... at the time of septic shock... April, 2011... septic cardiomyopathy  and  EF 40%... echo... sepsis syndrome... March 01, 2010  /  EF 60%... echo... 25th, 2011... sepsis improved... LV improved... no focal wall motion abnormalities Allergic rhinitis weight gain    September, 2011  Review of Systems       The patient denies fever, chills, headache, sweats, rash, change in vision, change in hearing, chest pain, cough, nausea vomiting, urinary symptoms.  He mentions some mild areas of skin pigmentation.  All of the systems are reviewed and are negative.  Vital Signs:  Patient profile:   55 year old male Height:      72 inches Weight:      210 pounds BMI:     28.58 Pulse rate:   90 / minute Resp:     14 per minute BP sitting:   118 / 80  (left arm)  Vitals Entered By: Kem Parkinson (July 29, 2010 8:56 AM)  Physical Exam  General:  The patient is stable today.  He has gained weight. Head:  head is atraumatic. Eyes:  no xanthelasma. Neck:  no jugular venous distention. Chest Wall:  no chest wall tenderness. Lungs:  lungs are clear.  Respiratory effort is unlabored. Heart:  cardiac exam reveals an S1-S2.  There are no clicks or significant murmurs. Abdomen:  abdomen is soft. Msk:  the patient has amputation of the tip of his left thumb and it continues to heal slowly. Extremities:  no peripheral  edema. Skin:  there are mild areas of increased redness in his skin.  This can be followed.  There is no sign of infection. Psych:  patient is oriented to person, place.  Affect is normal.   Impression & Recommendations:  Problem # 1:  * AREAS OF SKIN INCREASED REDNESS This has not ecchymoses.  This may be residual skin changes from his illness.  I do not feel that he needs definite workup at this time.  Over time he may want to follow with dermatology.  Problem # 2:  * WEIGHT GAIN I talk with him about his weight gain.  He'll begin to watch his diet better.  Problem # 3:  ATRIAL FIBRILLATION, PAROXYSMAL (ICD-427.31)  His updated medication list  for this problem includes:    Adult Aspirin Low Strength 81 Mg Tbdp (Aspirin) .Marland Kitchen... 1qd    Metoprolol Tartrate 25 Mg Tabs (Metoprolol tartrate) .Marland Kitchen... Take one tablet by mouth twice a day The patient had transient atrial fibrillation.  This occurred while he was critically ill.  His rhythm currently is regular.  Orders: Stress Echo (Stress Echo)  Problem # 4:  HYPERTENSION (ICD-401.9)  The following medications were removed from the medication list:    Lisinopril-hydrochlorothiazide 10-12.5 Mg Tabs (Lisinopril-hydrochlorothiazide) .Marland Kitchen... 1 by mouth once daily*office visit due now ** His updated medication list for this problem includes:    Adult Aspirin Low Strength 81 Mg Tbdp (Aspirin) .Marland Kitchen... 1qd    Metoprolol Tartrate 25 Mg Tabs (Metoprolol tartrate) .Marland Kitchen... Take one tablet by mouth twice a day Blood pressure control.  No change in therapy.  Problem # 5:  * TRANSIENT DECREASED LV FUNCTION WHEN SEPTIC We know that the patient's LV function returned to normal.  He did have some enzyme elevation.  It is now time to proceed with a stress echo to be sure that there is no evidence of ischemic disease.  As we arranged.  I will be in contact with him about the results.  If there are no significant abnormalities I plan to follow him up in one year.  Patient Instructions: 1)  Your physician has requested that you have a stress echocardiogram. For further information please visit [email or web address]  Please follow instruction sheet as given. 2)  Your physician wants you to follow-up in:  1 year.  You will receive a reminder letter in the mail two months in advance. If you don't receive a letter, please call our office to schedule the follow-up appointment.

## 2010-12-13 NOTE — Miscellaneous (Signed)
Summary: Appointment Canceled  Appointment status changed to canceled by [employer] on 08/08/2010 4:41 PM.  Cancellation Comments --------------------- c60/stress echo with joyce/saf  Appointment Information ----------------------- Appt Type:  EMR NO DOCUMENT      Date:  Tuesday, August 16, 2010      Time:  2:00 PM for 30 min   Urgency:  Routine   Made By:  Pearson Grippe  To Visit:  [id number]    Reason:  c60/stress echo with joyce/saf  Appt Comments ------------- -- 08/08/10 16:41: ([facility]) CANCELED -- c60/stress echo with joyce/saf -- 07/29/10 10:22: ([facility]) BOOKED -- Routine EMR NO DOCUMENT at 08/16/2010 2:00 PM for 30 min c60/stress echo with joyce/saf

## 2010-12-13 NOTE — Assessment & Plan Note (Signed)
Summary: eph/afib/renal failure/anemia/dysphagia/ok per heather/jml   Visit Type:  post hospital Primary Provider:  Abe People, MD   History of Present Illness: The patient is seen post hospitalization February 28, 2010 to Mar 22, 2010.  The patient had a septic syndrome with pneumonia.  He had vent dependent respiratory failure.  He was critically ill and has done extremely well.  He did have a brief run of atrial fibrillation when extremely ill.  This reverted to sinus rhythm.  Also, very early in his illness, he had mild troponin elevation and an echo showing some left ventricular dysfunction.  It was felt that this was due to his septic state.  Followup echo 6 days later showed return of normal function.  The patient had left ventricular dysfunction that was transient related to his septic episode.  There is no evidence that he has coronary artery disease.  Today he returns and he looks remarkably good.  He has continued healing in the left, where he required a small amount of surgery.  He is returned to work.  It is mildly weakened overall due to deconditioning but he is getting stronger.  He does not have chest pain.  There is no shortness of breath.  Current Medications (verified): 1)  Adult Aspirin Low Strength 81 Mg  Tbdp (Aspirin) .Marland Kitchen.. 1qd 2)  Glucosamine Complex   Tabs (Nutritional Supplements) .Marland Kitchen.. 1 Once Daily 3)  Claritin   Tabs (Loratadine Tabs) 4)  Zoloft 50 Mg  Tabs (Sertraline Hcl) .... Take One Half Tablet For One Week and Then One Tablet Daily 5)  Lisinopril-Hydrochlorothiazide 10-12.5 Mg Tabs (Lisinopril-Hydrochlorothiazide) .Marland Kitchen.. 1 By Mouth Once Daily*office Visit Due Now ** 6)  Tricor 145 Mg Tabs (Fenofibrate) .Marland Kitchen.. 1 By Mouth Once Daily 7)  Zocor 40 Mg Tabs (Simvastatin) .... Take One Tablet Each Evening At Bedtime.  Allergies: 1)  ! Morphine 2)  ! Darvon 3)  ! Codeine 4)  ! Demerol 5)  ! * Tetanus 6)  ! * Sulfa Drugs  Past History:  Past Surgical  History: Last updated: 04/28/2010  Debridement above with volar advancement flap.   Family History: Last updated: 04/28/2010 no cardiac hx in family  Social History: Last updated: 04/28/2010   The patient is married.  Wife is a Engineer, site.  The   patient works as a Hydrologist.  They have a two-level house  with bedroom downstairs.  Family plans to assist as needed.   The patient does not smoke, drink or do illicit drugs.   Past Medical History: Hyperlipidemia  Hypertensio  Ischemic left thumb with planned debridement per Dr. Mina Marble on   Mar 30, 2010.  Acute renal failure, resolved.   Atrial fibrillation.   Dysphagia.  Anemia.  Septic shock/ pneumonia / vent-dependent respiratory failure / multiorgan dysfunction with renal failure / [facility].Marland Kitchen February 28, 2010-Mar 22, 2010. Troponin elevation with LV dysfunction... at the time of septic shock... April, 2011... septic cardiomyopathy and  EF 40%... echo... sepsis syndrome... March 01, 2010  /  EF 60%... echo... 25th, 2011... sepsis improved... LV improved... no focal wall motion abnormalities Allergic rhinitis  Review of Systems       Patient denies fever, chills, headache, sweats, rash, change in vision, change in hearing, chest pain, cough, nausea vomiting, urinary symptoms.  All other systems are reviewed and are negative  Vital Signs:  Patient profile:   55 year old male Height:      72 inches Weight:  196 pounds BMI:     26.68 Pulse rate:   91 / minute Pulse rhythm:   regular BP sitting:   130 / 74  (left arm) Cuff size:   regular  Vitals Entered By: Stanton Kidney, EMT-P (April 29, 2010 8:55 AM)  Physical Exam  General:  patient looks great. Head:  head is atraumatic. Eyes:  no xanthelasma. Neck:  no jugular venous distention. Chest Wall:  no chest wall tenderness no chest wall tenderness. Lungs:  lungs are clear.  Respiratory effort is nonlabored. Heart:  cardiac exam reveals S1-S2.  No clicks or  significant murmurs. Abdomen:  abdomen is soft. Msk:  patient has a bandage on his left thumb Extremities:  no peripheral edema Skin:  no skin rashes. Psych:  patient is oriented to person time and place.  Affect is normal.  He is here with his wife today.   Impression & Recommendations:  Problem # 1:  ATRIAL FIBRILLATION, PAROXYSMAL (ICD-427.31)  The patient had a brief of atrial fibrillation while he was extremely ill with his sepsis syndrome.  I this reverted spontaneously to sinus rhythm.  He's had no further symptoms.  EKG is done today and reviewed by me.  He has normal sinus rhythm.  No further cardiac workup is needed for atrial fib.  Orders: EKG w/ Interpretation (93000)  Problem # 2:  * SEPSIS Judeth Porch FAILURE /RESPIRATORY FAILURE / APRIL, 2011 The patient has had a remarkable recovery from his episo  I willI have reviewed his echo isde of sepsis.  He was critically ill and he is greatly improved.  During his evaluation today I have reviewed multiple pages of hospital records.I have reviewed his echocardiogram reports and his lab reports.  Problem # 3:  HYPERTENSION (ICD-401.9)  His updated medication list for this problem includes:    Adult Aspirin Low Strength 81 Mg Tbdp (Aspirin) .Marland Kitchen... 1qd    Lisinopril-hydrochlorothiazide 10-12.5 Mg Tabs (Lisinopril-hydrochlorothiazide) .Marland Kitchen... 1 by mouth once daily*office visit due now ** Blood pressure is well controlled today.  No change in therapy.  Problem # 4:  * TRANSIENT DECREASED LV FUNCTION WHEN SEPTIC As outlined in the history of present illness the patient had transient decreased LV function at the time of his sepsis.  This improved completely.  There was slight enzyme elevation at that time.  There is no proof of myocardial infarction.  The patient is quite stable now.  EKG done today and reviewed by me.  He has mild nonspecific ST-T wave changes.  I will plan to see him back in 3 months.  At that time we will consider a stress  echo study to complete all cardiac evaluation.  Patient Instructions: 1)  Follow up in 3 months

## 2010-12-15 NOTE — Letter (Signed)
Summary: [facility]  [facility]   Imported By: Sherian Rein 10/28/2010 11:19:35  _____________________________________________________________________  External Attachment:    Type:   Image     Comment:   External Document

## 2010-12-15 NOTE — Letter (Signed)
Summary: [facility]  [facility]   Imported By: Sherian Rein 11/01/2010 14:01:40  _____________________________________________________________________  External Attachment:    Type:   Image     Comment:   External Document

## 2010-12-16 ENCOUNTER — Ambulatory Visit (HOSPITAL_BASED_OUTPATIENT_CLINIC_OR_DEPARTMENT_OTHER): Payer: BC Managed Care – PPO | Admitting: Oncology

## 2010-12-16 DIAGNOSIS — D693 Immune thrombocytopenic purpura: Secondary | ICD-10-CM

## 2010-12-16 DIAGNOSIS — D696 Thrombocytopenia, unspecified: Secondary | ICD-10-CM

## 2010-12-16 DIAGNOSIS — Z5112 Encounter for antineoplastic immunotherapy: Secondary | ICD-10-CM

## 2010-12-16 DIAGNOSIS — Z23 Encounter for immunization: Secondary | ICD-10-CM

## 2010-12-16 LAB — CBC WITH DIFFERENTIAL/PLATELET
Basophils Absolute: 0 10*3/uL (ref 0.0–0.1)
MCH: 31.8 pg (ref 27.2–33.4)
MONO%: 11.2 % (ref 0.0–14.0)
NEUT%: 45.9 % (ref 39.0–75.0)
RBC: 4.6 10*6/uL (ref 4.20–5.82)
RDW: 12.6 % (ref 11.0–14.6)
WBC: 3.6 10*3/uL — ABNORMAL LOW (ref 4.0–10.3)

## 2010-12-16 LAB — HEPATITIS C ANTIBODY: HCV Ab: NEGATIVE

## 2010-12-21 NOTE — Letter (Signed)
Summary: [facility]  [facility]   Imported By: Marylou Mccoy 12/12/2010 16:23:48  _____________________________________________________________________  External Attachment:    Type:   Image     Comment:   External Document

## 2010-12-30 ENCOUNTER — Encounter (HOSPITAL_BASED_OUTPATIENT_CLINIC_OR_DEPARTMENT_OTHER): Payer: BC Managed Care – PPO | Admitting: Oncology

## 2010-12-30 ENCOUNTER — Other Ambulatory Visit: Payer: Self-pay | Admitting: Oncology

## 2010-12-30 DIAGNOSIS — Z5112 Encounter for antineoplastic immunotherapy: Secondary | ICD-10-CM

## 2010-12-30 DIAGNOSIS — D693 Immune thrombocytopenic purpura: Secondary | ICD-10-CM

## 2010-12-30 DIAGNOSIS — D696 Thrombocytopenia, unspecified: Secondary | ICD-10-CM

## 2010-12-30 LAB — CBC WITH DIFFERENTIAL/PLATELET
BASO%: 0.7 % (ref 0.0–2.0)
HCT: 40.4 % (ref 38.4–49.9)
HGB: 14.3 g/dL (ref 13.0–17.1)
LYMPH%: 36.4 % (ref 14.0–49.0)
MCH: 31.8 pg (ref 27.2–33.4)
MCV: 89.6 fL (ref 79.3–98.0)
MONO%: 5.4 % (ref 0.0–14.0)
NEUT#: 3 10*3/uL (ref 1.5–6.5)
NEUT%: 56.7 % (ref 39.0–75.0)
WBC: 5.3 10*3/uL (ref 4.0–10.3)

## 2011-01-13 ENCOUNTER — Encounter (HOSPITAL_BASED_OUTPATIENT_CLINIC_OR_DEPARTMENT_OTHER): Payer: BC Managed Care – PPO | Admitting: Oncology

## 2011-01-13 ENCOUNTER — Other Ambulatory Visit: Payer: Self-pay | Admitting: Oncology

## 2011-01-13 DIAGNOSIS — D693 Immune thrombocytopenic purpura: Secondary | ICD-10-CM

## 2011-01-13 DIAGNOSIS — Z5112 Encounter for antineoplastic immunotherapy: Secondary | ICD-10-CM

## 2011-01-13 DIAGNOSIS — D696 Thrombocytopenia, unspecified: Secondary | ICD-10-CM

## 2011-01-13 LAB — CBC WITH DIFFERENTIAL/PLATELET
BASO%: 0.6 % (ref 0.0–2.0)
EOS%: 1.3 % (ref 0.0–7.0)
MCH: 30.8 pg (ref 27.2–33.4)
MCHC: 35.6 g/dL (ref 32.0–36.0)
MONO#: 0.4 10*3/uL (ref 0.1–0.9)
RBC: 4.91 10*6/uL (ref 4.20–5.82)
RDW: 12.6 % (ref 11.0–14.6)
WBC: 4.8 10*3/uL (ref 4.0–10.3)
lymph#: 1.9 10*3/uL (ref 0.9–3.3)

## 2011-01-23 LAB — POCT I-STAT, CHEM 8
BUN: 16 mg/dL (ref 6–23)
Calcium, Ion: 1.2 mmol/L (ref 1.12–1.32)
Chloride: 109 mEq/L (ref 96–112)
Creatinine, Ser: 1.1 mg/dL (ref 0.4–1.5)
Glucose, Bld: 122 mg/dL — ABNORMAL HIGH (ref 70–99)
HCT: 40 % (ref 39.0–52.0)
Hemoglobin: 13.6 g/dL (ref 13.0–17.0)
Potassium: 4 mEq/L (ref 3.5–5.1)
Sodium: 139 mEq/L (ref 135–145)
TCO2: 22 mmol/L (ref 0–100)

## 2011-01-23 LAB — PREPARE PLATELETS: Unit division: 0

## 2011-01-24 LAB — COMPREHENSIVE METABOLIC PANEL
ALT: 24 U/L (ref 0–53)
ALT: 30 U/L (ref 0–53)
AST: 29 U/L (ref 0–37)
Albumin: 3.2 g/dL — ABNORMAL LOW (ref 3.5–5.2)
Albumin: 4.5 g/dL (ref 3.5–5.2)
Alkaline Phosphatase: 30 U/L — ABNORMAL LOW (ref 39–117)
Alkaline Phosphatase: 45 U/L (ref 39–117)
CO2: 25 mEq/L (ref 19–32)
Chloride: 104 mEq/L (ref 96–112)
Chloride: 104 mEq/L (ref 96–112)
Creatinine, Ser: 1.44 mg/dL (ref 0.4–1.5)
GFR calc Af Amer: >60 mL/min (ref >60)
GFR calc non Af Amer: 51 mL/min — ABNORMAL LOW (ref >60)
Potassium: 3.8 mEq/L (ref 3.5–5.1)
Potassium: 4.1 mEq/L (ref 3.5–5.1)
Sodium: 134 mEq/L — ABNORMAL LOW (ref 135–145)
Sodium: 138 mEq/L (ref 135–145)
Total Bilirubin: 0.3 mg/dL (ref 0.3–1.2)
Total Bilirubin: 2.2 mg/dL — ABNORMAL HIGH (ref 0.3–1.2)
Total Protein: 7.8 g/dL (ref 6.0–8.3)

## 2011-01-24 LAB — DIFFERENTIAL
Basophils Absolute: 0 10*3/uL (ref 0.0–0.1)
Basophils Absolute: 0 10*3/uL (ref 0.0–0.1)
Basophils Relative: 0 % (ref 0–1)
Basophils Relative: 1 % (ref 0–1)
Basophils Relative: 1 % (ref 0–1)
Basophils Relative: 1 % (ref 0–1)
Eosinophils Absolute: 0 10*3/uL (ref 0.0–0.7)
Eosinophils Absolute: 0.1 10*3/uL (ref 0.0–0.7)
Eosinophils Relative: 0 % (ref 0–5)
Eosinophils Relative: 1 % (ref 0–5)
Eosinophils Relative: 2 % (ref 0–5)
Lymphocytes Relative: 7 % — ABNORMAL LOW (ref 12–46)
Lymphs Abs: 1.2 10*3/uL (ref 0.7–4.0)
Lymphs Abs: 1.3 10*3/uL (ref 0.7–4.0)
Lymphs Abs: 2.7 10*3/uL (ref 0.7–4.0)
Lymphs Abs: 2.7 10*3/uL (ref 0.7–4.0)
Monocytes Absolute: 0.4 10*3/uL (ref 0.1–1.0)
Monocytes Absolute: 0.4 10*3/uL (ref 0.1–1.0)
Monocytes Absolute: 0.8 10*3/uL (ref 0.1–1.0)
Monocytes Absolute: 0.8 10*3/uL (ref 0.1–1.0)
Monocytes Relative: 5 % (ref 3–12)
Monocytes Relative: 9 % (ref 3–12)
Neutro Abs: 15 10*3/uL — ABNORMAL HIGH (ref 1.7–7.7)
Neutro Abs: 2.6 10*3/uL (ref 1.7–7.7)
Neutro Abs: 2.9 10*3/uL (ref 1.7–7.7)
Neutrophils Relative %: 88 % — ABNORMAL HIGH (ref 43–77)
Neutrophils Relative %: 91 % — ABNORMAL HIGH (ref 43–77)
Smear Review: ADEQUATE

## 2011-01-24 LAB — CBC
HCT: 33.9 % — ABNORMAL LOW (ref 39.0–52.0)
HCT: 37 % — ABNORMAL LOW (ref 39.0–52.0)
Hemoglobin: 11.3 g/dL — ABNORMAL LOW (ref 13.0–17.0)
Hemoglobin: 11.7 g/dL — ABNORMAL LOW (ref 13.0–17.0)
Hemoglobin: 14.1 g/dL (ref 13.0–17.0)
MCH: 31.2 pg (ref 26.0–34.0)
MCH: 34.2 pg — ABNORMAL HIGH (ref 26.0–34.0)
MCHC: 34.5 g/dL (ref 30.0–36.0)
MCHC: 35.4 g/dL (ref 30.0–36.0)
MCHC: 37.5 g/dL — ABNORMAL HIGH (ref 30.0–36.0)
MCV: 86.5 fL (ref 78.0–100.0)
MCV: 88.6 fL (ref 78.0–100.0)
MCV: 90.4 fL (ref 78.0–100.0)
Platelets: 11 10*3/uL — CL (ref 150–400)
Platelets: 40 10*3/uL — ABNORMAL LOW (ref 150–400)
RBC: 3.75 MIL/uL — ABNORMAL LOW (ref 4.22–5.81)
RBC: 4.2 MIL/uL — ABNORMAL LOW (ref 4.22–5.81)
RBC: 4.52 MIL/uL (ref 4.22–5.81)
RDW: 12.9 % (ref 11.5–15.5)
RDW: 13.1 % (ref 11.5–15.5)
RDW: 13.1 % (ref 11.5–15.5)
WBC: 16.5 10*3/uL — ABNORMAL HIGH (ref 4.0–10.5)
WBC: 6.2 10*3/uL (ref 4.0–10.5)

## 2011-01-24 LAB — URINE MICROSCOPIC-ADD ON

## 2011-01-24 LAB — URINALYSIS, ROUTINE W REFLEX MICROSCOPIC
Glucose, UA: NEGATIVE mg/dL
Ketones, ur: NEGATIVE mg/dL
Ketones, ur: NEGATIVE mg/dL
Leukocytes, UA: NEGATIVE
Nitrite: NEGATIVE
Nitrite: NEGATIVE
Specific Gravity, Urine: 1.009 (ref 1.005–1.030)
Specific Gravity, Urine: 1.011 (ref 1.005–1.030)
pH: 5.5 (ref 5.0–8.0)
pH: 6 (ref 5.0–8.0)

## 2011-01-24 LAB — TYPE AND SCREEN

## 2011-01-24 LAB — TECHNOLOGIST SMEAR REVIEW

## 2011-01-24 LAB — CK TOTAL AND CKMB (NOT AT ARMC)
CK, MB: 1 ng/mL (ref 0.3–4.0)
Relative Index: INVALID (ref 0.0–2.5)
Total CK: 26 U/L (ref 7–232)

## 2011-01-24 LAB — SAMPLE TO BLOOD BANK

## 2011-01-24 LAB — POCT CARDIAC MARKERS
CKMB, poc: <1 ng/mL — ABNORMAL LOW (ref 1.0–8.0)
Myoglobin, poc: 60.3 ng/mL (ref 12–200)

## 2011-01-24 LAB — PROTIME-INR: Prothrombin Time: 15.1 seconds (ref 11.6–15.2)

## 2011-01-24 LAB — PARVOVIRUS B19 ANTIBODY, IGG AND IGM: Parovirus B19 IgM Abs: <0.9 Index (ref <0.9)

## 2011-01-24 LAB — CK: Total CK: 81 U/L (ref 7–232)

## 2011-01-24 NOTE — Progress Notes (Signed)
Summary: [facility]: Office Visit  [facility]: Office Visit   Imported By: Earl Many 01/16/2011 09:25:22  _____________________________________________________________________  External Attachment:    Type:   Image     Comment:   External Document

## 2011-01-27 ENCOUNTER — Other Ambulatory Visit: Payer: Self-pay | Admitting: Oncology

## 2011-01-27 ENCOUNTER — Encounter (HOSPITAL_BASED_OUTPATIENT_CLINIC_OR_DEPARTMENT_OTHER): Payer: BC Managed Care – PPO | Admitting: Oncology

## 2011-01-27 DIAGNOSIS — Z5112 Encounter for antineoplastic immunotherapy: Secondary | ICD-10-CM

## 2011-01-27 DIAGNOSIS — D696 Thrombocytopenia, unspecified: Secondary | ICD-10-CM

## 2011-01-27 DIAGNOSIS — D693 Immune thrombocytopenic purpura: Secondary | ICD-10-CM

## 2011-01-27 LAB — CBC WITH DIFFERENTIAL/PLATELET
EOS%: 2 % (ref 0.0–7.0)
Eosinophils Absolute: 0.1 10*3/uL (ref 0.0–0.5)
LYMPH%: 41.3 % (ref 14.0–49.0)
MCH: 30.5 pg (ref 27.2–33.4)
MCV: 87.8 fL (ref 79.3–98.0)
MONO%: 9 % (ref 0.0–14.0)
Platelets: 93 10*3/uL — ABNORMAL LOW (ref 140–400)
RBC: 4.75 10*6/uL (ref 4.20–5.82)
RDW: 13.1 % (ref 11.0–14.6)

## 2011-01-30 LAB — CBC
Hemoglobin: 9.4 g/dL — ABNORMAL LOW (ref 13.0–17.0)
MCHC: 34.9 g/dL (ref 30.0–36.0)
RBC: 2.91 MIL/uL — ABNORMAL LOW (ref 4.22–5.81)
RDW: 14.2 % (ref 11.5–15.5)

## 2011-01-30 LAB — BASIC METABOLIC PANEL
CO2: 29 mEq/L (ref 19–32)
Calcium: 9.3 mg/dL (ref 8.4–10.5)
Creatinine, Ser: 0.93 mg/dL (ref 0.4–1.5)
GFR calc Af Amer: >60 mL/min (ref >60)
GFR calc non Af Amer: >60 mL/min (ref >60)
Glucose, Bld: 93 mg/dL (ref 70–99)
Sodium: 136 mEq/L (ref 135–145)

## 2011-01-31 LAB — GLUCOSE, CAPILLARY
Glucose-Capillary: 102 mg/dL — ABNORMAL HIGH (ref 70–99)
Glucose-Capillary: 103 mg/dL — ABNORMAL HIGH (ref 70–99)
Glucose-Capillary: 108 mg/dL — ABNORMAL HIGH (ref 70–99)
Glucose-Capillary: 117 mg/dL — ABNORMAL HIGH (ref 70–99)
Glucose-Capillary: 120 mg/dL — ABNORMAL HIGH (ref 70–99)
Glucose-Capillary: 124 mg/dL — ABNORMAL HIGH (ref 70–99)
Glucose-Capillary: 125 mg/dL — ABNORMAL HIGH (ref 70–99)
Glucose-Capillary: 132 mg/dL — ABNORMAL HIGH (ref 70–99)
Glucose-Capillary: 139 mg/dL — ABNORMAL HIGH (ref 70–99)
Glucose-Capillary: 139 mg/dL — ABNORMAL HIGH (ref 70–99)
Glucose-Capillary: 140 mg/dL — ABNORMAL HIGH (ref 70–99)
Glucose-Capillary: 152 mg/dL — ABNORMAL HIGH (ref 70–99)
Glucose-Capillary: 153 mg/dL — ABNORMAL HIGH (ref 70–99)
Glucose-Capillary: 157 mg/dL — ABNORMAL HIGH (ref 70–99)
Glucose-Capillary: 160 mg/dL — ABNORMAL HIGH (ref 70–99)
Glucose-Capillary: 164 mg/dL — ABNORMAL HIGH (ref 70–99)
Glucose-Capillary: 165 mg/dL — ABNORMAL HIGH (ref 70–99)
Glucose-Capillary: 168 mg/dL — ABNORMAL HIGH (ref 70–99)
Glucose-Capillary: 177 mg/dL — ABNORMAL HIGH (ref 70–99)
Glucose-Capillary: 183 mg/dL — ABNORMAL HIGH (ref 70–99)
Glucose-Capillary: 75 mg/dL (ref 70–99)
Glucose-Capillary: 84 mg/dL (ref 70–99)
Glucose-Capillary: 95 mg/dL (ref 70–99)
Glucose-Capillary: 98 mg/dL (ref 70–99)
Glucose-Capillary: 118 mg/dL — ABNORMAL HIGH (ref 70–99)
Glucose-Capillary: 122 mg/dL — ABNORMAL HIGH (ref 70–99)
Glucose-Capillary: 129 mg/dL — ABNORMAL HIGH (ref 70–99)
Glucose-Capillary: 134 mg/dL — ABNORMAL HIGH (ref 70–99)
Glucose-Capillary: 134 mg/dL — ABNORMAL HIGH (ref 70–99)
Glucose-Capillary: 135 mg/dL — ABNORMAL HIGH (ref 70–99)
Glucose-Capillary: 136 mg/dL — ABNORMAL HIGH (ref 70–99)
Glucose-Capillary: 139 mg/dL — ABNORMAL HIGH (ref 70–99)
Glucose-Capillary: 142 mg/dL — ABNORMAL HIGH (ref 70–99)
Glucose-Capillary: 143 mg/dL — ABNORMAL HIGH (ref 70–99)
Glucose-Capillary: 145 mg/dL — ABNORMAL HIGH (ref 70–99)
Glucose-Capillary: 145 mg/dL — ABNORMAL HIGH (ref 70–99)
Glucose-Capillary: 148 mg/dL — ABNORMAL HIGH (ref 70–99)
Glucose-Capillary: 148 mg/dL — ABNORMAL HIGH (ref 70–99)
Glucose-Capillary: 150 mg/dL — ABNORMAL HIGH (ref 70–99)
Glucose-Capillary: 150 mg/dL — ABNORMAL HIGH (ref 70–99)
Glucose-Capillary: 151 mg/dL — ABNORMAL HIGH (ref 70–99)
Glucose-Capillary: 151 mg/dL — ABNORMAL HIGH (ref 70–99)
Glucose-Capillary: 151 mg/dL — ABNORMAL HIGH (ref 70–99)
Glucose-Capillary: 152 mg/dL — ABNORMAL HIGH (ref 70–99)
Glucose-Capillary: 152 mg/dL — ABNORMAL HIGH (ref 70–99)
Glucose-Capillary: 152 mg/dL — ABNORMAL HIGH (ref 70–99)
Glucose-Capillary: 152 mg/dL — ABNORMAL HIGH (ref 70–99)
Glucose-Capillary: 153 mg/dL — ABNORMAL HIGH (ref 70–99)
Glucose-Capillary: 154 mg/dL — ABNORMAL HIGH (ref 70–99)
Glucose-Capillary: 158 mg/dL — ABNORMAL HIGH (ref 70–99)
Glucose-Capillary: 160 mg/dL — ABNORMAL HIGH (ref 70–99)
Glucose-Capillary: 162 mg/dL — ABNORMAL HIGH (ref 70–99)
Glucose-Capillary: 162 mg/dL — ABNORMAL HIGH (ref 70–99)
Glucose-Capillary: 163 mg/dL — ABNORMAL HIGH (ref 70–99)
Glucose-Capillary: 165 mg/dL — ABNORMAL HIGH (ref 70–99)
Glucose-Capillary: 166 mg/dL — ABNORMAL HIGH (ref 70–99)
Glucose-Capillary: 166 mg/dL — ABNORMAL HIGH (ref 70–99)
Glucose-Capillary: 173 mg/dL — ABNORMAL HIGH (ref 70–99)
Glucose-Capillary: 173 mg/dL — ABNORMAL HIGH (ref 70–99)
Glucose-Capillary: 178 mg/dL — ABNORMAL HIGH (ref 70–99)
Glucose-Capillary: 211 mg/dL — ABNORMAL HIGH (ref 70–99)
Glucose-Capillary: 212 mg/dL — ABNORMAL HIGH (ref 70–99)
Glucose-Capillary: 215 mg/dL — ABNORMAL HIGH (ref 70–99)
Glucose-Capillary: 23 mg/dL — CL (ref 70–99)
Glucose-Capillary: 261 mg/dL — ABNORMAL HIGH (ref 70–99)
Glucose-Capillary: 264 mg/dL — ABNORMAL HIGH (ref 70–99)
Glucose-Capillary: 42 mg/dL — CL (ref 70–99)
Glucose-Capillary: 52 mg/dL — ABNORMAL LOW (ref 70–99)
Glucose-Capillary: 83 mg/dL (ref 70–99)
Glucose-Capillary: 95 mg/dL (ref 70–99)

## 2011-01-31 LAB — CARBOXYHEMOGLOBIN
Carboxyhemoglobin: 0.8 % (ref 0.5–1.5)
Carboxyhemoglobin: 0.9 % (ref 0.5–1.5)
Carboxyhemoglobin: 1 % (ref 0.5–1.5)
Methemoglobin: 0.4 % (ref 0.0–1.5)
Methemoglobin: 1 % (ref 0.0–1.5)
Methemoglobin: 2.8 % — ABNORMAL HIGH (ref 0.0–1.5)
O2 Saturation: 68.2 %
O2 Saturation: 77 %
Total hemoglobin: 11.6 g/dL — ABNORMAL LOW (ref 13.5–18.0)

## 2011-01-31 LAB — POCT I-STAT, CHEM 8
BUN: 39 mg/dL — ABNORMAL HIGH (ref 6–23)
BUN: 41 mg/dL — ABNORMAL HIGH (ref 6–23)
BUN: 42 mg/dL — ABNORMAL HIGH (ref 6–23)
BUN: 43 mg/dL — ABNORMAL HIGH (ref 6–23)
BUN: 45 mg/dL — ABNORMAL HIGH (ref 6–23)
BUN: 46 mg/dL — ABNORMAL HIGH (ref 6–23)
BUN: 46 mg/dL — ABNORMAL HIGH (ref 6–23)
BUN: 47 mg/dL — ABNORMAL HIGH (ref 6–23)
BUN: 47 mg/dL — ABNORMAL HIGH (ref 6–23)
BUN: 48 mg/dL — ABNORMAL HIGH (ref 6–23)
BUN: 48 mg/dL — ABNORMAL HIGH (ref 6–23)
BUN: 48 mg/dL — ABNORMAL HIGH (ref 6–23)
BUN: 48 mg/dL — ABNORMAL HIGH (ref 6–23)
BUN: 49 mg/dL — ABNORMAL HIGH (ref 6–23)
BUN: 49 mg/dL — ABNORMAL HIGH (ref 6–23)
BUN: 51 mg/dL — ABNORMAL HIGH (ref 6–23)
BUN: 52 mg/dL — ABNORMAL HIGH (ref 6–23)
Calcium, Ion: 0.42 mmol/L — CL (ref 1.12–1.32)
Calcium, Ion: 0.43 mmol/L — CL (ref 1.12–1.32)
Calcium, Ion: 0.5 mmol/L — CL (ref 1.12–1.32)
Calcium, Ion: 0.67 mmol/L — CL (ref 1.12–1.32)
Calcium, Ion: 0.73 mmol/L — CL (ref 1.12–1.32)
Calcium, Ion: 0.79 mmol/L — ABNORMAL LOW (ref 1.12–1.32)
Calcium, Ion: 0.83 mmol/L — ABNORMAL LOW (ref 1.12–1.32)
Calcium, Ion: 0.85 mmol/L — ABNORMAL LOW (ref 1.12–1.32)
Calcium, Ion: 0.88 mmol/L — ABNORMAL LOW (ref 1.12–1.32)
Calcium, Ion: 1.3 mmol/L (ref 1.12–1.32)
Chloride: 100 mEq/L (ref 96–112)
Chloride: 100 mEq/L (ref 96–112)
Chloride: 100 mEq/L (ref 96–112)
Chloride: 100 mEq/L (ref 96–112)
Chloride: 100 mEq/L (ref 96–112)
Chloride: 100 mEq/L (ref 96–112)
Chloride: 100 mEq/L (ref 96–112)
Chloride: 101 mEq/L (ref 96–112)
Chloride: 101 mEq/L (ref 96–112)
Chloride: 101 mEq/L (ref 96–112)
Chloride: 101 mEq/L (ref 96–112)
Chloride: 101 mEq/L (ref 96–112)
Chloride: 101 mEq/L (ref 96–112)
Chloride: 102 mEq/L (ref 96–112)
Chloride: 102 mEq/L (ref 96–112)
Chloride: 102 mEq/L (ref 96–112)
Chloride: 104 mEq/L (ref 96–112)
Chloride: 104 mEq/L (ref 96–112)
Chloride: 96 mEq/L (ref 96–112)
Chloride: 97 mEq/L (ref 96–112)
Chloride: 97 mEq/L (ref 96–112)
Chloride: 97 mEq/L (ref 96–112)
Chloride: 97 mEq/L (ref 96–112)
Chloride: 97 mEq/L (ref 96–112)
Chloride: 99 mEq/L (ref 96–112)
Creatinine, Ser: 2.6 mg/dL — ABNORMAL HIGH (ref 0.4–1.5)
Creatinine, Ser: 3 mg/dL — ABNORMAL HIGH (ref 0.4–1.5)
Creatinine, Ser: 3 mg/dL — ABNORMAL HIGH (ref 0.4–1.5)
Creatinine, Ser: 3.1 mg/dL — ABNORMAL HIGH (ref 0.4–1.5)
Creatinine, Ser: 3.2 mg/dL — ABNORMAL HIGH (ref 0.4–1.5)
Creatinine, Ser: 3.5 mg/dL — ABNORMAL HIGH (ref 0.4–1.5)
Creatinine, Ser: 3.7 mg/dL — ABNORMAL HIGH (ref 0.4–1.5)
Creatinine, Ser: 3.8 mg/dL — ABNORMAL HIGH (ref 0.4–1.5)
Glucose, Bld: 180 mg/dL — ABNORMAL HIGH (ref 70–99)
Glucose, Bld: 182 mg/dL — ABNORMAL HIGH (ref 70–99)
Glucose, Bld: 195 mg/dL — ABNORMAL HIGH (ref 70–99)
Glucose, Bld: 217 mg/dL — ABNORMAL HIGH (ref 70–99)
Glucose, Bld: 218 mg/dL — ABNORMAL HIGH (ref 70–99)
Glucose, Bld: 263 mg/dL — ABNORMAL HIGH (ref 70–99)
HCT: 35 % — ABNORMAL LOW (ref 39.0–52.0)
HCT: 37 % — ABNORMAL LOW (ref 39.0–52.0)
HCT: 38 % — ABNORMAL LOW (ref 39.0–52.0)
HCT: 38 % — ABNORMAL LOW (ref 39.0–52.0)
HCT: 39 % (ref 39.0–52.0)
HCT: 39 % (ref 39.0–52.0)
HCT: 41 % (ref 39.0–52.0)
HCT: 41 % (ref 39.0–52.0)
HCT: 41 % (ref 39.0–52.0)
HCT: 42 % (ref 39.0–52.0)
HCT: 43 % (ref 39.0–52.0)
HCT: 43 % (ref 39.0–52.0)
HCT: 44 % (ref 39.0–52.0)
HCT: 44 % (ref 39.0–52.0)
HCT: 44 % (ref 39.0–52.0)
HCT: 45 % (ref 39.0–52.0)
HCT: 46 % (ref 39.0–52.0)
HCT: 49 % (ref 39.0–52.0)
HCT: 50 % (ref 39.0–52.0)
Hemoglobin: 11.9 g/dL — ABNORMAL LOW (ref 13.0–17.0)
Hemoglobin: 12.6 g/dL — ABNORMAL LOW (ref 13.0–17.0)
Hemoglobin: 12.6 g/dL — ABNORMAL LOW (ref 13.0–17.0)
Hemoglobin: 12.9 g/dL — ABNORMAL LOW (ref 13.0–17.0)
Hemoglobin: 12.9 g/dL — ABNORMAL LOW (ref 13.0–17.0)
Hemoglobin: 13.6 g/dL (ref 13.0–17.0)
Hemoglobin: 14.6 g/dL (ref 13.0–17.0)
Hemoglobin: 15 g/dL (ref 13.0–17.0)
Hemoglobin: 15 g/dL (ref 13.0–17.0)
Hemoglobin: 15.3 g/dL (ref 13.0–17.0)
Hemoglobin: 15.6 g/dL (ref 13.0–17.0)
Potassium: 3.7 mEq/L (ref 3.5–5.1)
Potassium: 3.8 mEq/L (ref 3.5–5.1)
Potassium: 3.8 mEq/L (ref 3.5–5.1)
Potassium: 3.8 mEq/L (ref 3.5–5.1)
Potassium: 3.8 mEq/L (ref 3.5–5.1)
Potassium: 3.8 mEq/L (ref 3.5–5.1)
Potassium: 3.8 mEq/L (ref 3.5–5.1)
Potassium: 3.9 mEq/L (ref 3.5–5.1)
Potassium: 3.9 mEq/L (ref 3.5–5.1)
Potassium: 3.9 mEq/L (ref 3.5–5.1)
Potassium: 3.9 mEq/L (ref 3.5–5.1)
Potassium: 3.9 mEq/L (ref 3.5–5.1)
Potassium: 3.9 mEq/L (ref 3.5–5.1)
Potassium: 3.9 mEq/L (ref 3.5–5.1)
Potassium: 4 mEq/L (ref 3.5–5.1)
Potassium: 4 mEq/L (ref 3.5–5.1)
Potassium: 4 mEq/L (ref 3.5–5.1)
Potassium: 4.1 mEq/L (ref 3.5–5.1)
Potassium: 4.1 mEq/L (ref 3.5–5.1)
Potassium: 4.1 mEq/L (ref 3.5–5.1)
Potassium: 4.2 mEq/L (ref 3.5–5.1)
Potassium: 4.2 mEq/L (ref 3.5–5.1)
Sodium: 136 mEq/L (ref 135–145)
Sodium: 136 mEq/L (ref 135–145)
Sodium: 136 mEq/L (ref 135–145)
Sodium: 136 mEq/L (ref 135–145)
Sodium: 137 mEq/L (ref 135–145)
Sodium: 138 mEq/L (ref 135–145)
Sodium: 138 mEq/L (ref 135–145)
Sodium: 138 mEq/L (ref 135–145)
Sodium: 138 mEq/L (ref 135–145)
Sodium: 138 mEq/L (ref 135–145)
Sodium: 138 mEq/L (ref 135–145)
Sodium: 139 mEq/L (ref 135–145)
Sodium: 139 mEq/L (ref 135–145)
Sodium: 139 mEq/L (ref 135–145)
Sodium: 139 mEq/L (ref 135–145)
Sodium: 139 mEq/L (ref 135–145)
Sodium: 139 mEq/L (ref 135–145)
Sodium: 139 mEq/L (ref 135–145)
Sodium: 140 mEq/L (ref 135–145)
Sodium: 140 mEq/L (ref 135–145)
Sodium: 140 mEq/L (ref 135–145)
Sodium: 140 mEq/L (ref 135–145)
Sodium: 140 mEq/L (ref 135–145)
Sodium: 140 mEq/L (ref 135–145)
TCO2: 26 mmol/L (ref 0–100)
TCO2: 27 mmol/L (ref 0–100)
TCO2: 28 mmol/L (ref 0–100)
TCO2: 28 mmol/L (ref 0–100)
TCO2: 28 mmol/L (ref 0–100)
TCO2: 28 mmol/L (ref 0–100)
TCO2: 28 mmol/L (ref 0–100)
TCO2: 29 mmol/L (ref 0–100)

## 2011-01-31 LAB — CROSSMATCH
ABO/RH(D): A POS
Antibody Screen: NEGATIVE

## 2011-01-31 LAB — BASIC METABOLIC PANEL
BUN: 37 mg/dL — ABNORMAL HIGH (ref 6–23)
BUN: 47 mg/dL — ABNORMAL HIGH (ref 6–23)
BUN: 66 mg/dL — ABNORMAL HIGH (ref 6–23)
BUN: 95 mg/dL — ABNORMAL HIGH (ref 6–23)
BUN: 35 mg/dL — ABNORMAL HIGH (ref 6–23)
BUN: 44 mg/dL — ABNORMAL HIGH (ref 6–23)
BUN: 67 mg/dL — ABNORMAL HIGH (ref 6–23)
BUN: 88 mg/dL — ABNORMAL HIGH (ref 6–23)
CO2: 22 mEq/L (ref 19–32)
CO2: 23 mEq/L (ref 19–32)
CO2: 24 mEq/L (ref 19–32)
CO2: 28 mEq/L (ref 19–32)
CO2: 13 mEq/L — ABNORMAL LOW (ref 19–32)
CO2: 25 mEq/L (ref 19–32)
Calcium: 7.6 mg/dL — ABNORMAL LOW (ref 8.4–10.5)
Calcium: 8.2 mg/dL — ABNORMAL LOW (ref 8.4–10.5)
Calcium: 8.4 mg/dL (ref 8.4–10.5)
Calcium: 8.6 mg/dL (ref 8.4–10.5)
Calcium: 8.6 mg/dL (ref 8.4–10.5)
Calcium: 6.8 mg/dL — ABNORMAL LOW (ref 8.4–10.5)
Calcium: 6.9 mg/dL — ABNORMAL LOW (ref 8.4–10.5)
Chloride: 107 mEq/L (ref 96–112)
Chloride: 109 mEq/L (ref 96–112)
Chloride: 117 mEq/L — ABNORMAL HIGH (ref 96–112)
Chloride: 101 mEq/L (ref 96–112)
Chloride: 102 mEq/L (ref 96–112)
Chloride: 103 mEq/L (ref 96–112)
Chloride: 103 mEq/L (ref 96–112)
Chloride: 111 mEq/L (ref 96–112)
Creatinine, Ser: 0.92 mg/dL (ref 0.4–1.5)
Creatinine, Ser: 1.19 mg/dL (ref 0.4–1.5)
Creatinine, Ser: 1.25 mg/dL (ref 0.4–1.5)
Creatinine, Ser: 2.76 mg/dL — ABNORMAL HIGH (ref 0.4–1.5)
Creatinine, Ser: 2.84 mg/dL — ABNORMAL HIGH (ref 0.4–1.5)
Creatinine, Ser: 3.22 mg/dL — ABNORMAL HIGH (ref 0.4–1.5)
Creatinine, Ser: 4.96 mg/dL — ABNORMAL HIGH (ref 0.4–1.5)
Creatinine, Ser: 6.6 mg/dL — ABNORMAL HIGH (ref 0.4–1.5)
GFR calc Af Amer: 29 mL/min — ABNORMAL LOW (ref >60)
GFR calc Af Amer: >60 mL/min (ref >60)
GFR calc Af Amer: >60 mL/min (ref >60)
GFR calc Af Amer: 10 mL/min — ABNORMAL LOW (ref >60)
GFR calc Af Amer: 11 mL/min — ABNORMAL LOW (ref >60)
GFR calc Af Amer: 15 mL/min — ABNORMAL LOW (ref >60)
GFR calc Af Amer: 28 mL/min — ABNORMAL LOW (ref >60)
GFR calc Af Amer: 28 mL/min — ABNORMAL LOW (ref >60)
GFR calc non Af Amer: 27 mL/min — ABNORMAL LOW (ref >60)
GFR calc non Af Amer: 29 mL/min — ABNORMAL LOW (ref >60)
GFR calc non Af Amer: 58 mL/min — ABNORMAL LOW (ref >60)
GFR calc non Af Amer: >60 mL/min (ref >60)
GFR calc non Af Amer: 20 mL/min — ABNORMAL LOW (ref >60)
GFR calc non Af Amer: 23 mL/min — ABNORMAL LOW (ref >60)
GFR calc non Af Amer: 23 mL/min — ABNORMAL LOW (ref >60)
GFR calc non Af Amer: 9 mL/min — ABNORMAL LOW (ref >60)
Glucose, Bld: 123 mg/dL — ABNORMAL HIGH (ref 70–99)
Glucose, Bld: 129 mg/dL — ABNORMAL HIGH (ref 70–99)
Glucose, Bld: 142 mg/dL — ABNORMAL HIGH (ref 70–99)
Glucose, Bld: 168 mg/dL — ABNORMAL HIGH (ref 70–99)
Glucose, Bld: 153 mg/dL — ABNORMAL HIGH (ref 70–99)
Potassium: 3.6 mEq/L (ref 3.5–5.1)
Potassium: 3.8 mEq/L (ref 3.5–5.1)
Potassium: 3.9 mEq/L (ref 3.5–5.1)
Potassium: 4.4 mEq/L (ref 3.5–5.1)
Potassium: 4.2 mEq/L (ref 3.5–5.1)
Potassium: 4.2 mEq/L (ref 3.5–5.1)
Potassium: 4.3 mEq/L (ref 3.5–5.1)
Potassium: 4.3 mEq/L (ref 3.5–5.1)
Potassium: 4.4 mEq/L (ref 3.5–5.1)
Sodium: 136 mEq/L (ref 135–145)
Sodium: 140 mEq/L (ref 135–145)
Sodium: 141 mEq/L (ref 135–145)
Sodium: 150 mEq/L — ABNORMAL HIGH (ref 135–145)
Sodium: 151 mEq/L — ABNORMAL HIGH (ref 135–145)
Sodium: 132 mEq/L — ABNORMAL LOW (ref 135–145)
Sodium: 142 mEq/L (ref 135–145)

## 2011-01-31 LAB — DIFFERENTIAL
Basophils Absolute: 0 10*3/uL (ref 0.0–0.1)
Basophils Absolute: 0 10*3/uL (ref 0.0–0.1)
Basophils Absolute: 0.3 10*3/uL — ABNORMAL HIGH (ref 0.0–0.1)
Eosinophils Absolute: 0.2 10*3/uL (ref 0.0–0.7)
Eosinophils Absolute: 0 10*3/uL (ref 0.0–0.7)
Eosinophils Absolute: 0.1 10*3/uL (ref 0.0–0.7)
Eosinophils Relative: 4 % (ref 0–5)
Lymphocytes Relative: 40 % (ref 12–46)
Lymphocytes Relative: 15 % (ref 12–46)
Lymphocytes Relative: 3 % — ABNORMAL LOW (ref 12–46)
Lymphs Abs: 0.5 10*3/uL — ABNORMAL LOW (ref 0.7–4.0)
Lymphs Abs: 2.2 10*3/uL (ref 0.7–4.0)
Monocytes Absolute: 0.2 10*3/uL (ref 0.1–1.0)
Neutro Abs: 16 10*3/uL — ABNORMAL HIGH (ref 1.7–7.7)
Neutrophils Relative %: 48 % (ref 43–77)
Neutrophils Relative %: 81 % — ABNORMAL HIGH (ref 43–77)

## 2011-01-31 LAB — POCT I-STAT 3, ART BLOOD GAS (G3+)
Acid-Base Excess: 2 mmol/L (ref 0.0–2.0)
Acid-Base Excess: 4 mmol/L — ABNORMAL HIGH (ref 0.0–2.0)
Acid-Base Excess: 4 mmol/L — ABNORMAL HIGH (ref 0.0–2.0)
Acid-base deficit: 1 mmol/L (ref 0.0–2.0)
Bicarbonate: 14 mEq/L — ABNORMAL LOW (ref 20.0–24.0)
Bicarbonate: 25.5 mEq/L — ABNORMAL HIGH (ref 20.0–24.0)
Bicarbonate: 25.8 mEq/L — ABNORMAL HIGH (ref 20.0–24.0)
Bicarbonate: 26.8 mEq/L — ABNORMAL HIGH (ref 20.0–24.0)
Bicarbonate: 28.3 mEq/L — ABNORMAL HIGH (ref 20.0–24.0)
Bicarbonate: 28.4 mEq/L — ABNORMAL HIGH (ref 20.0–24.0)
Bicarbonate: 28.7 mEq/L — ABNORMAL HIGH (ref 20.0–24.0)
Bicarbonate: 29.1 mEq/L — ABNORMAL HIGH (ref 20.0–24.0)
Bicarbonate: 29.8 mEq/L — ABNORMAL HIGH (ref 20.0–24.0)
O2 Saturation: 100 %
O2 Saturation: 93 %
O2 Saturation: 96 %
O2 Saturation: 97 %
O2 Saturation: 99 %
O2 Saturation: 99 %
O2 Saturation: 99 %
Patient temperature: 100.1
Patient temperature: 100.3
Patient temperature: 103.4
Patient temperature: 36.3
Patient temperature: 37.4
Patient temperature: 38
Patient temperature: 97.2
Patient temperature: 98.5
TCO2: 21 mmol/L (ref 0–100)
TCO2: 23 mmol/L (ref 0–100)
TCO2: 24 mmol/L (ref 0–100)
TCO2: 27 mmol/L (ref 0–100)
TCO2: 28 mmol/L (ref 0–100)
TCO2: 30 mmol/L (ref 0–100)
TCO2: 31 mmol/L (ref 0–100)
TCO2: 31 mmol/L (ref 0–100)
pCO2 arterial: 31 mmHg — ABNORMAL LOW (ref 35.0–45.0)
pCO2 arterial: 31.3 mmHg — ABNORMAL LOW (ref 35.0–45.0)
pCO2 arterial: 37.7 mmHg (ref 35.0–45.0)
pCO2 arterial: 41.4 mmHg (ref 35.0–45.0)
pCO2 arterial: 41.7 mmHg (ref 35.0–45.0)
pCO2 arterial: 44.8 mmHg (ref 35.0–45.0)
pCO2 arterial: 45.9 mmHg — ABNORMAL HIGH (ref 35.0–45.0)
pH, Arterial: 7.256 — ABNORMAL LOW (ref 7.350–7.450)
pH, Arterial: 7.408 (ref 7.350–7.450)
pH, Arterial: 7.413 (ref 7.350–7.450)
pH, Arterial: 7.428 (ref 7.350–7.450)
pH, Arterial: 7.439 (ref 7.350–7.450)
pH, Arterial: 7.442 (ref 7.350–7.450)
pH, Arterial: 7.509 — ABNORMAL HIGH (ref 7.350–7.450)
pO2, Arterial: 104 mmHg — ABNORMAL HIGH (ref 80.0–100.0)
pO2, Arterial: 107 mmHg — ABNORMAL HIGH (ref 80.0–100.0)
pO2, Arterial: 114 mmHg — ABNORMAL HIGH (ref 80.0–100.0)
pO2, Arterial: 125 mmHg — ABNORMAL HIGH (ref 80.0–100.0)
pO2, Arterial: 130 mmHg — ABNORMAL HIGH (ref 80.0–100.0)
pO2, Arterial: 222 mmHg — ABNORMAL HIGH (ref 80.0–100.0)
pO2, Arterial: 74 mmHg — ABNORMAL LOW (ref 80.0–100.0)

## 2011-01-31 LAB — CLOSTRIDIUM DIFFICILE EIA: C difficile Toxins A+B, EIA: NEGATIVE

## 2011-01-31 LAB — CBC
HCT: 21.2 % — ABNORMAL LOW (ref 39.0–52.0)
HCT: 22.8 % — ABNORMAL LOW (ref 39.0–52.0)
HCT: 23.4 % — ABNORMAL LOW (ref 39.0–52.0)
HCT: 26.5 % — ABNORMAL LOW (ref 39.0–52.0)
HCT: 27.5 % — ABNORMAL LOW (ref 39.0–52.0)
HCT: 31.9 % — ABNORMAL LOW (ref 39.0–52.0)
HCT: 33.3 % — ABNORMAL LOW (ref 39.0–52.0)
HCT: 34.9 % — ABNORMAL LOW (ref 39.0–52.0)
HCT: 37.2 % — ABNORMAL LOW (ref 39.0–52.0)
HCT: 37.5 % — ABNORMAL LOW (ref 39.0–52.0)
HCT: 39.6 % (ref 39.0–52.0)
HCT: 41 % (ref 39.0–52.0)
Hemoglobin: 7.1 g/dL — ABNORMAL LOW (ref 13.0–17.0)
Hemoglobin: 7.7 g/dL — ABNORMAL LOW (ref 13.0–17.0)
Hemoglobin: 7.8 g/dL — ABNORMAL LOW (ref 13.0–17.0)
Hemoglobin: 8.8 g/dL — ABNORMAL LOW (ref 13.0–17.0)
Hemoglobin: 9.1 g/dL — ABNORMAL LOW (ref 13.0–17.0)
Hemoglobin: 9.3 g/dL — ABNORMAL LOW (ref 13.0–17.0)
Hemoglobin: 11.6 g/dL — ABNORMAL LOW (ref 13.0–17.0)
Hemoglobin: 11.7 g/dL — ABNORMAL LOW (ref 13.0–17.0)
Hemoglobin: 12.9 g/dL — ABNORMAL LOW (ref 13.0–17.0)
Hemoglobin: 13.7 g/dL (ref 13.0–17.0)
Hemoglobin: 13.8 g/dL (ref 13.0–17.0)
Hemoglobin: 9.2 g/dL — ABNORMAL LOW (ref 13.0–17.0)
Hemoglobin: 9.6 g/dL — ABNORMAL LOW (ref 13.0–17.0)
MCHC: 34.1 g/dL (ref 30.0–36.0)
MCHC: 34.3 g/dL (ref 30.0–36.0)
MCHC: 34.6 g/dL (ref 30.0–36.0)
MCHC: 34.7 g/dL (ref 30.0–36.0)
MCHC: 34.8 g/dL (ref 30.0–36.0)
MCHC: 33.4 g/dL (ref 30.0–36.0)
MCHC: 33.7 g/dL (ref 30.0–36.0)
MCHC: 34.6 g/dL (ref 30.0–36.0)
MCHC: 34.9 g/dL (ref 30.0–36.0)
MCHC: 35 g/dL (ref 30.0–36.0)
MCHC: 35.1 g/dL (ref 30.0–36.0)
MCHC: 35.5 g/dL (ref 30.0–36.0)
MCHC: 35.9 g/dL (ref 30.0–36.0)
MCV: 91.7 fL (ref 78.0–100.0)
MCV: 92.1 fL (ref 78.0–100.0)
MCV: 92.3 fL (ref 78.0–100.0)
MCV: 92.5 fL (ref 78.0–100.0)
MCV: 89.5 fL (ref 78.0–100.0)
MCV: 89.5 fL (ref 78.0–100.0)
MCV: 89.8 fL (ref 78.0–100.0)
MCV: 90.5 fL (ref 78.0–100.0)
MCV: 90.6 fL (ref 78.0–100.0)
MCV: 91 fL (ref 78.0–100.0)
MCV: 91.6 fL (ref 78.0–100.0)
MCV: 93 fL (ref 78.0–100.0)
Platelets: 200 10*3/uL (ref 150–400)
Platelets: 212 10*3/uL (ref 150–400)
Platelets: 222 10*3/uL (ref 150–400)
Platelets: 227 10*3/uL (ref 150–400)
Platelets: 262 10*3/uL (ref 150–400)
Platelets: 266 10*3/uL (ref 150–400)
Platelets: 130 10*3/uL — ABNORMAL LOW (ref 150–400)
Platelets: 148 10*3/uL — ABNORMAL LOW (ref 150–400)
Platelets: 177 10*3/uL (ref 150–400)
Platelets: 56 10*3/uL — ABNORMAL LOW (ref 150–400)
Platelets: 57 10*3/uL — ABNORMAL LOW (ref 150–400)
Platelets: 96 10*3/uL — ABNORMAL LOW (ref 150–400)
RBC: 2.4 MIL/uL — ABNORMAL LOW (ref 4.22–5.81)
RBC: 2.44 MIL/uL — ABNORMAL LOW (ref 4.22–5.81)
RBC: 2.45 MIL/uL — ABNORMAL LOW (ref 4.22–5.81)
RBC: 2.75 MIL/uL — ABNORMAL LOW (ref 4.22–5.81)
RBC: 2.87 MIL/uL — ABNORMAL LOW (ref 4.22–5.81)
RBC: 2.94 MIL/uL — ABNORMAL LOW (ref 4.22–5.81)
RBC: 3.03 MIL/uL — ABNORMAL LOW (ref 4.22–5.81)
RBC: 3.69 MIL/uL — ABNORMAL LOW (ref 4.22–5.81)
RBC: 3.73 MIL/uL — ABNORMAL LOW (ref 4.22–5.81)
RBC: 3.77 MIL/uL — ABNORMAL LOW (ref 4.22–5.81)
RBC: 3.85 MIL/uL — ABNORMAL LOW (ref 4.22–5.81)
RBC: 4.12 MIL/uL — ABNORMAL LOW (ref 4.22–5.81)
RBC: 4.21 MIL/uL — ABNORMAL LOW (ref 4.22–5.81)
RBC: 4.35 MIL/uL (ref 4.22–5.81)
RBC: 4.41 MIL/uL (ref 4.22–5.81)
RBC: 5.16 MIL/uL (ref 4.22–5.81)
RDW: 13.7 % (ref 11.5–15.5)
RDW: 13.8 % (ref 11.5–15.5)
RDW: 13.9 % (ref 11.5–15.5)
RDW: 14.4 % (ref 11.5–15.5)
RDW: 14.5 % (ref 11.5–15.5)
RDW: 15.2 % (ref 11.5–15.5)
RDW: 13.4 % (ref 11.5–15.5)
RDW: 13.7 % (ref 11.5–15.5)
RDW: 14.1 % (ref 11.5–15.5)
RDW: 14.3 % (ref 11.5–15.5)
RDW: 14.6 % (ref 11.5–15.5)
RDW: 14.8 % (ref 11.5–15.5)
WBC: 3.5 10*3/uL — ABNORMAL LOW (ref 4.0–10.5)
WBC: 4.2 10*3/uL (ref 4.0–10.5)
WBC: 4.9 10*3/uL (ref 4.0–10.5)
WBC: 5.6 10*3/uL (ref 4.0–10.5)
WBC: 6.5 10*3/uL (ref 4.0–10.5)
WBC: 8.9 10*3/uL (ref 4.0–10.5)
WBC: 14.1 10*3/uL — ABNORMAL HIGH (ref 4.0–10.5)
WBC: 14.6 10*3/uL — ABNORMAL HIGH (ref 4.0–10.5)
WBC: 16.7 10*3/uL — ABNORMAL HIGH (ref 4.0–10.5)
WBC: 28.5 10*3/uL — ABNORMAL HIGH (ref 4.0–10.5)
WBC: 35.3 10*3/uL — ABNORMAL HIGH (ref 4.0–10.5)
WBC: 37.7 10*3/uL — ABNORMAL HIGH (ref 4.0–10.5)
WBC: 38.1 10*3/uL — ABNORMAL HIGH (ref 4.0–10.5)

## 2011-01-31 LAB — RENAL FUNCTION PANEL
Albumin: 1.6 g/dL — ABNORMAL LOW (ref 3.5–5.2)
Albumin: 1.7 g/dL — ABNORMAL LOW (ref 3.5–5.2)
Albumin: 1.9 g/dL — ABNORMAL LOW (ref 3.5–5.2)
Albumin: 1.9 g/dL — ABNORMAL LOW (ref 3.5–5.2)
Albumin: 2.5 g/dL — ABNORMAL LOW (ref 3.5–5.2)
BUN: 103 mg/dL — ABNORMAL HIGH (ref 6–23)
BUN: 120 mg/dL — ABNORMAL HIGH (ref 6–23)
BUN: 142 mg/dL — ABNORMAL HIGH (ref 6–23)
BUN: 34 mg/dL — ABNORMAL HIGH (ref 6–23)
BUN: 44 mg/dL — ABNORMAL HIGH (ref 6–23)
BUN: 44 mg/dL — ABNORMAL HIGH (ref 6–23)
CO2: 23 mEq/L (ref 19–32)
CO2: 24 mEq/L (ref 19–32)
CO2: 27 mEq/L (ref 19–32)
CO2: 28 mEq/L (ref 19–32)
CO2: 28 mEq/L (ref 19–32)
CO2: 28 mEq/L (ref 19–32)
Calcium: 5.4 mg/dL — CL (ref 8.4–10.5)
Calcium: 5.8 mg/dL — CL (ref 8.4–10.5)
Calcium: 6 mg/dL — CL (ref 8.4–10.5)
Calcium: 6.3 mg/dL — CL (ref 8.4–10.5)
Calcium: 9.9 mg/dL (ref 8.4–10.5)
Chloride: 101 mEq/L (ref 96–112)
Chloride: 102 mEq/L (ref 96–112)
Chloride: 107 mEq/L (ref 96–112)
Chloride: 109 mEq/L (ref 96–112)
Chloride: 112 mEq/L (ref 96–112)
Chloride: 92 mEq/L — ABNORMAL LOW (ref 96–112)
Chloride: 93 mEq/L — ABNORMAL LOW (ref 96–112)
Chloride: 97 mEq/L (ref 96–112)
Creatinine, Ser: 3.74 mg/dL — ABNORMAL HIGH (ref 0.4–1.5)
Creatinine, Ser: 4.1 mg/dL — ABNORMAL HIGH (ref 0.4–1.5)
Creatinine, Ser: 4.14 mg/dL — ABNORMAL HIGH (ref 0.4–1.5)
Creatinine, Ser: 5.02 mg/dL — ABNORMAL HIGH (ref 0.4–1.5)
Creatinine, Ser: 6.95 mg/dL — ABNORMAL HIGH (ref 0.4–1.5)
GFR calc Af Amer: 14 mL/min — ABNORMAL LOW (ref >60)
GFR calc Af Amer: 15 mL/min — ABNORMAL LOW (ref >60)
GFR calc Af Amer: 18 mL/min — ABNORMAL LOW (ref >60)
GFR calc Af Amer: 19 mL/min — ABNORMAL LOW (ref >60)
GFR calc Af Amer: 21 mL/min — ABNORMAL LOW (ref >60)
GFR calc Af Amer: 22 mL/min — ABNORMAL LOW (ref >60)
GFR calc non Af Amer: 12 mL/min — ABNORMAL LOW (ref >60)
GFR calc non Af Amer: 12 mL/min — ABNORMAL LOW (ref >60)
GFR calc non Af Amer: 15 mL/min — ABNORMAL LOW (ref >60)
GFR calc non Af Amer: 16 mL/min — ABNORMAL LOW (ref >60)
GFR calc non Af Amer: 17 mL/min — ABNORMAL LOW (ref >60)
GFR calc non Af Amer: 18 mL/min — ABNORMAL LOW (ref >60)
Glucose, Bld: 127 mg/dL — ABNORMAL HIGH (ref 70–99)
Glucose, Bld: 138 mg/dL — ABNORMAL HIGH (ref 70–99)
Glucose, Bld: 147 mg/dL — ABNORMAL HIGH (ref 70–99)
Glucose, Bld: 169 mg/dL — ABNORMAL HIGH (ref 70–99)
Glucose, Bld: 186 mg/dL — ABNORMAL HIGH (ref 70–99)
Glucose, Bld: 200 mg/dL — ABNORMAL HIGH (ref 70–99)
Glucose, Bld: 213 mg/dL — ABNORMAL HIGH (ref 70–99)
Phosphorus: 4.2 mg/dL (ref 2.3–4.6)
Phosphorus: 5.2 mg/dL — ABNORMAL HIGH (ref 2.3–4.6)
Phosphorus: 6 mg/dL — ABNORMAL HIGH (ref 2.3–4.6)
Phosphorus: 7.2 mg/dL — ABNORMAL HIGH (ref 2.3–4.6)
Phosphorus: 8.2 mg/dL — ABNORMAL HIGH (ref 2.3–4.6)
Potassium: 3.4 mEq/L — ABNORMAL LOW (ref 3.5–5.1)
Potassium: 3.7 mEq/L (ref 3.5–5.1)
Potassium: 3.9 mEq/L (ref 3.5–5.1)
Potassium: 3.9 mEq/L (ref 3.5–5.1)
Potassium: 4.1 mEq/L (ref 3.5–5.1)
Potassium: 4.2 mEq/L (ref 3.5–5.1)
Potassium: 4.9 mEq/L (ref 3.5–5.1)
Potassium: 5 mEq/L (ref 3.5–5.1)
Sodium: 129 mEq/L — ABNORMAL LOW (ref 135–145)
Sodium: 130 mEq/L — ABNORMAL LOW (ref 135–145)
Sodium: 130 mEq/L — ABNORMAL LOW (ref 135–145)
Sodium: 135 mEq/L (ref 135–145)
Sodium: 135 mEq/L (ref 135–145)
Sodium: 135 mEq/L (ref 135–145)
Sodium: 136 mEq/L (ref 135–145)

## 2011-01-31 LAB — URINE CULTURE
Colony Count: NO GROWTH
Colony Count: NO GROWTH
Culture: NO GROWTH

## 2011-01-31 LAB — BLOOD GAS, ARTERIAL
Acid-base deficit: 14.5 mmol/L — ABNORMAL HIGH (ref 0.0–2.0)
Acid-base deficit: 14.7 mmol/L — ABNORMAL HIGH (ref 0.0–2.0)
Acid-base deficit: 15.1 mmol/L — ABNORMAL HIGH (ref 0.0–2.0)
Bicarbonate: 12.2 mEq/L — ABNORMAL LOW (ref 20.0–24.0)
Bicarbonate: 22.3 mEq/L (ref 20.0–24.0)
Drawn by: 145321
Drawn by: 2707211
FIO2: 0.4 %
FIO2: 0.8 %
FIO2: 1 %
MECHVT: 500 mL
MECHVT: 500 mL
O2 Saturation: 97.3 %
O2 Saturation: 99 %
PEEP: 5 cmH2O
PEEP: 5 cmH2O
PEEP: 5 cmH2O
Patient temperature: 98.6
Patient temperature: 98.6
RATE: 14 resp/min
RATE: 30 resp/min
TCO2: 11.4 mmol/L (ref 0–100)
TCO2: 12.5 mmol/L (ref 0–100)
pCO2 arterial: 38.7 mmHg (ref 35.0–45.0)
pCO2 arterial: 62.3 mmHg (ref 35.0–45.0)
pH, Arterial: 7.162 — CL (ref 7.350–7.450)
pH, Arterial: 7.474 — ABNORMAL HIGH (ref 7.350–7.450)
pO2, Arterial: 103 mmHg — ABNORMAL HIGH (ref 80.0–100.0)
pO2, Arterial: 119 mmHg — ABNORMAL HIGH (ref 80.0–100.0)
pO2, Arterial: 119 mmHg — ABNORMAL HIGH (ref 80.0–100.0)
pO2, Arterial: 129 mmHg — ABNORMAL HIGH (ref 80.0–100.0)
pO2, Arterial: 216 mmHg — ABNORMAL HIGH (ref 80.0–100.0)

## 2011-01-31 LAB — COMPREHENSIVE METABOLIC PANEL
ALT: 107 U/L — ABNORMAL HIGH (ref 0–53)
ALT: 93 U/L — ABNORMAL HIGH (ref 0–53)
ALT: 121 U/L — ABNORMAL HIGH (ref 0–53)
ALT: 128 U/L — ABNORMAL HIGH (ref 0–53)
ALT: 144 U/L — ABNORMAL HIGH (ref 0–53)
ALT: 198 U/L — ABNORMAL HIGH (ref 0–53)
ALT: 43 U/L (ref 0–53)
AST: 41 U/L — ABNORMAL HIGH (ref 0–37)
AST: 214 U/L — ABNORMAL HIGH (ref 0–37)
AST: 231 U/L — ABNORMAL HIGH (ref 0–37)
AST: 47 U/L — ABNORMAL HIGH (ref 0–37)
Albumin: 1.8 g/dL — ABNORMAL LOW (ref 3.5–5.2)
Alkaline Phosphatase: 50 U/L (ref 39–117)
Alkaline Phosphatase: 128 U/L — ABNORMAL HIGH (ref 39–117)
Alkaline Phosphatase: 152 U/L — ABNORMAL HIGH (ref 39–117)
Alkaline Phosphatase: 88 U/L (ref 39–117)
BUN: 36 mg/dL — ABNORMAL HIGH (ref 6–23)
BUN: 40 mg/dL — ABNORMAL HIGH (ref 6–23)
CO2: 26 mEq/L (ref 19–32)
CO2: 27 mEq/L (ref 19–32)
CO2: 19 mEq/L (ref 19–32)
CO2: 20 mEq/L (ref 19–32)
CO2: 21 mEq/L (ref 19–32)
CO2: 25 mEq/L (ref 19–32)
CO2: 25 mEq/L (ref 19–32)
CO2: 30 mEq/L (ref 19–32)
Calcium: 5.9 mg/dL — CL (ref 8.4–10.5)
Calcium: 6 mg/dL — CL (ref 8.4–10.5)
Calcium: 6.1 mg/dL — CL (ref 8.4–10.5)
Chloride: 107 mEq/L (ref 96–112)
Chloride: 100 mEq/L (ref 96–112)
Chloride: 109 mEq/L (ref 96–112)
Chloride: 95 mEq/L — ABNORMAL LOW (ref 96–112)
Creatinine, Ser: 1.06 mg/dL (ref 0.4–1.5)
Creatinine, Ser: 3.73 mg/dL — ABNORMAL HIGH (ref 0.4–1.5)
Creatinine, Ser: 3.84 mg/dL — ABNORMAL HIGH (ref 0.4–1.5)
Creatinine, Ser: 6.43 mg/dL — ABNORMAL HIGH (ref 0.4–1.5)
Creatinine, Ser: 6.54 mg/dL — ABNORMAL HIGH (ref 0.4–1.5)
GFR calc Af Amer: >60 mL/min (ref >60)
GFR calc Af Amer: 11 mL/min — ABNORMAL LOW (ref >60)
GFR calc Af Amer: 17 mL/min — ABNORMAL LOW (ref >60)
GFR calc Af Amer: 21 mL/min — ABNORMAL LOW (ref >60)
GFR calc non Af Amer: 36 mL/min — ABNORMAL LOW (ref >60)
GFR calc non Af Amer: >60 mL/min (ref >60)
GFR calc non Af Amer: 17 mL/min — ABNORMAL LOW (ref >60)
GFR calc non Af Amer: 17 mL/min — ABNORMAL LOW (ref >60)
GFR calc non Af Amer: 17 mL/min — ABNORMAL LOW (ref >60)
GFR calc non Af Amer: 9 mL/min — ABNORMAL LOW (ref >60)
GFR calc non Af Amer: 9 mL/min — ABNORMAL LOW (ref >60)
Glucose, Bld: 137 mg/dL — ABNORMAL HIGH (ref 70–99)
Glucose, Bld: 85 mg/dL (ref 70–99)
Glucose, Bld: 133 mg/dL — ABNORMAL HIGH (ref 70–99)
Glucose, Bld: 168 mg/dL — ABNORMAL HIGH (ref 70–99)
Glucose, Bld: 184 mg/dL — ABNORMAL HIGH (ref 70–99)
Glucose, Bld: 99 mg/dL (ref 70–99)
Potassium: 4.8 mEq/L (ref 3.5–5.1)
Potassium: 3.8 mEq/L (ref 3.5–5.1)
Potassium: 4 mEq/L (ref 3.5–5.1)
Potassium: 5 mEq/L (ref 3.5–5.1)
Sodium: 150 mEq/L — ABNORMAL HIGH (ref 135–145)
Sodium: 130 mEq/L — ABNORMAL LOW (ref 135–145)
Sodium: 134 mEq/L — ABNORMAL LOW (ref 135–145)
Sodium: 134 mEq/L — ABNORMAL LOW (ref 135–145)
Sodium: 141 mEq/L (ref 135–145)
Total Bilirubin: 0.6 mg/dL (ref 0.3–1.2)
Total Bilirubin: 2 mg/dL — ABNORMAL HIGH (ref 0.3–1.2)
Total Bilirubin: 5.6 mg/dL — ABNORMAL HIGH (ref 0.3–1.2)
Total Bilirubin: 7.3 mg/dL — ABNORMAL HIGH (ref 0.3–1.2)
Total Protein: 6.6 g/dL (ref 6.0–8.3)
Total Protein: 4.8 g/dL — ABNORMAL LOW (ref 6.0–8.3)

## 2011-01-31 LAB — HEPATIC FUNCTION PANEL
ALT: 114 U/L — ABNORMAL HIGH (ref 0–53)
AST: 72 U/L — ABNORMAL HIGH (ref 0–37)
Albumin: 2 g/dL — ABNORMAL LOW (ref 3.5–5.2)
Alkaline Phosphatase: 75 U/L (ref 39–117)
Total Bilirubin: 1.2 mg/dL (ref 0.3–1.2)
Total Protein: 6.4 g/dL (ref 6.0–8.3)

## 2011-01-31 LAB — PROTIME-INR
INR: 1.89 — ABNORMAL HIGH (ref 0.00–1.49)
Prothrombin Time: 14.8 seconds (ref 11.6–15.2)
Prothrombin Time: 20 seconds — ABNORMAL HIGH (ref 11.6–15.2)
Prothrombin Time: 21.5 seconds — ABNORMAL HIGH (ref 11.6–15.2)

## 2011-01-31 LAB — DIC (DISSEMINATED INTRAVASCULAR COAGULATION)PANEL
D-Dimer, Quant: 8 ug/mL-FEU — ABNORMAL HIGH (ref 0.00–0.48)
Fibrinogen: 629 mg/dL — ABNORMAL HIGH (ref 204–475)
Fibrinogen: 678 mg/dL — ABNORMAL HIGH (ref 204–475)
Platelets: 165 10*3/uL (ref 150–400)
Platelets: 53 10*3/uL — ABNORMAL LOW (ref 150–400)
Smear Review: NONE SEEN
aPTT: 93 seconds — ABNORMAL HIGH (ref 24–37)

## 2011-01-31 LAB — CARDIAC PANEL(CRET KIN+CKTOT+MB+TROPI)
Relative Index: 2.4 (ref 0.0–2.5)
Total CK: 12181 U/L — ABNORMAL HIGH (ref 7–232)
Troponin I: 0.05 ng/mL (ref 0.00–0.06)
Troponin I: 14.55 ng/mL (ref 0.00–0.06)
Troponin I: 6.38 ng/mL (ref 0.00–0.06)

## 2011-01-31 LAB — MAGNESIUM
Magnesium: 2.2 mg/dL (ref 1.5–2.5)
Magnesium: 1.2 mg/dL — ABNORMAL LOW (ref 1.5–2.5)
Magnesium: 1.8 mg/dL (ref 1.5–2.5)
Magnesium: 2 mg/dL (ref 1.5–2.5)
Magnesium: 2.2 mg/dL (ref 1.5–2.5)
Magnesium: 2.3 mg/dL (ref 1.5–2.5)
Magnesium: 2.3 mg/dL (ref 1.5–2.5)
Magnesium: 2.4 mg/dL (ref 1.5–2.5)

## 2011-01-31 LAB — RETICULOCYTES
RBC.: 2.6 MIL/uL — ABNORMAL LOW (ref 4.22–5.81)
RBC.: 3.91 MIL/uL — ABNORMAL LOW (ref 4.22–5.81)
Retic Count, Absolute: 20.8 10*3/uL (ref 19.0–186.0)
Retic Ct Pct: 0.8 % (ref 0.4–3.1)

## 2011-01-31 LAB — HEPARIN INDUCED THROMBOCYTOPENIA PNL
Heparin Induced Plt Ab: NEGATIVE
UFH SRA Result: NEGATIVE

## 2011-01-31 LAB — HEPATITIS PANEL, ACUTE
HCV Ab: NEGATIVE
Hep A IgM: NEGATIVE
Hep B C IgM: NEGATIVE
Hepatitis B Surface Ag: NEGATIVE

## 2011-01-31 LAB — URINALYSIS, MICROSCOPIC ONLY
Glucose, UA: 100 mg/dL — AB
Ketones, ur: 15 mg/dL — AB
Protein, ur: >300 mg/dL — AB
Urobilinogen, UA: 1 mg/dL (ref 0.0–1.0)

## 2011-01-31 LAB — CALCIUM, IONIZED
Calcium, Ion: 0.79 mmol/L — ABNORMAL LOW (ref 1.12–1.32)
Calcium, Ion: 0.91 mmol/L — ABNORMAL LOW (ref 1.12–1.32)

## 2011-01-31 LAB — URINALYSIS, ROUTINE W REFLEX MICROSCOPIC
Glucose, UA: 100 mg/dL — AB
Nitrite: NEGATIVE
Protein, ur: >300 mg/dL — AB
Specific Gravity, Urine: 1.014 (ref 1.005–1.030)
Specific Gravity, Urine: 1.035 — ABNORMAL HIGH (ref 1.005–1.030)
Urobilinogen, UA: 1 mg/dL (ref 0.0–1.0)
pH: 6 (ref 5.0–8.0)

## 2011-01-31 LAB — URINE MICROSCOPIC-ADD ON

## 2011-01-31 LAB — VITAMIN B12
Vitamin B-12: 1041 pg/mL — ABNORMAL HIGH (ref 211–911)
Vitamin B-12: >2000 pg/mL — ABNORMAL HIGH (ref 211–911)

## 2011-01-31 LAB — CULTURE, BLOOD (ROUTINE X 2)
Culture: NO GROWTH
Culture: NO GROWTH
Culture: NO GROWTH
Culture: NO GROWTH
Culture: NO GROWTH

## 2011-01-31 LAB — APTT
aPTT: 131 seconds — ABNORMAL HIGH (ref 24–37)
aPTT: 45 seconds — ABNORMAL HIGH (ref 24–37)

## 2011-01-31 LAB — AMMONIA: Ammonia: 32 umol/L (ref 11–35)

## 2011-01-31 LAB — VANCOMYCIN, RANDOM: Vancomycin Rm: 33.4 ug/mL

## 2011-01-31 LAB — MRSA PCR SCREENING: MRSA by PCR: NEGATIVE

## 2011-01-31 LAB — VANCOMYCIN, TROUGH: Vancomycin Tr: 33.4 ug/mL (ref 10.0–20.0)

## 2011-01-31 LAB — LACTIC ACID, PLASMA
Lactic Acid, Venous: 1 mmol/L (ref 0.5–2.2)
Lactic Acid, Venous: 1.6 mmol/L (ref 0.5–2.2)
Lactic Acid, Venous: 7.6 mmol/L — ABNORMAL HIGH (ref 0.5–2.2)
Lactic Acid, Venous: 8.1 mmol/L — ABNORMAL HIGH (ref 0.5–2.2)

## 2011-01-31 LAB — CULTURE, BAL-QUANTITATIVE W GRAM STAIN: Colony Count: 15000

## 2011-01-31 LAB — ALT: ALT: 179 U/L — ABNORMAL HIGH (ref 0–53)

## 2011-01-31 LAB — CULTURE, RESPIRATORY W GRAM STAIN

## 2011-01-31 LAB — GLUCOSE, RANDOM: Glucose, Bld: 128 mg/dL — ABNORMAL HIGH (ref 70–99)

## 2011-01-31 LAB — VITAMIN D 1,25 DIHYDROXY
Vitamin D 1, 25 (OH)2 Total: 17 pg/mL — ABNORMAL LOW (ref 18–72)
Vitamin D2 1, 25 (OH)2: <8 pg/mL
Vitamin D3 1, 25 (OH)2: 17 pg/mL

## 2011-01-31 LAB — PROCALCITONIN
Procalcitonin: 1.11 ng/mL
Procalcitonin: 17.29 ng/mL

## 2011-01-31 LAB — IRON AND TIBC: Iron: 104 ug/dL (ref 42–135)

## 2011-01-31 LAB — PTH, INTACT AND CALCIUM: PTH: 416.4 pg/mL — ABNORMAL HIGH (ref 14.0–72.0)

## 2011-01-31 LAB — D-DIMER, QUANTITATIVE: D-Dimer, Quant: 5.4 ug/mL-FEU — ABNORMAL HIGH (ref 0.00–0.48)

## 2011-01-31 LAB — PHOSPHORUS
Phosphorus: 3.6 mg/dL (ref 2.3–4.6)
Phosphorus: 4.4 mg/dL (ref 2.3–4.6)

## 2011-01-31 LAB — FERRITIN: Ferritin: 569 ng/mL — ABNORMAL HIGH (ref 22–322)

## 2011-01-31 LAB — HEPATITIS B SURFACE ANTIBODY,QUALITATIVE: Hep B S Ab: NEGATIVE

## 2011-01-31 LAB — FOLATE: Folate: 11.5 ng/mL

## 2011-01-31 LAB — POCT CARDIAC MARKERS
CKMB, poc: 16 ng/mL (ref 1.0–8.0)
Troponin i, poc: <0.05 ng/mL (ref 0.00–0.09)

## 2011-01-31 LAB — LIPASE, BLOOD: Lipase: 93 U/L — ABNORMAL HIGH (ref 11–59)

## 2011-01-31 LAB — PREALBUMIN: Prealbumin: 6.1 mg/dL — ABNORMAL LOW (ref 18.0–45.0)

## 2011-01-31 LAB — TYPE AND SCREEN: Antibody Screen: NEGATIVE

## 2011-01-31 LAB — ABO/RH: ABO/RH(D): A POS

## 2011-02-10 ENCOUNTER — Encounter (HOSPITAL_BASED_OUTPATIENT_CLINIC_OR_DEPARTMENT_OTHER): Payer: BC Managed Care – PPO | Admitting: Oncology

## 2011-02-10 ENCOUNTER — Other Ambulatory Visit: Payer: Self-pay | Admitting: Oncology

## 2011-02-10 DIAGNOSIS — Z5112 Encounter for antineoplastic immunotherapy: Secondary | ICD-10-CM

## 2011-02-10 DIAGNOSIS — D693 Immune thrombocytopenic purpura: Secondary | ICD-10-CM

## 2011-02-10 DIAGNOSIS — D696 Thrombocytopenia, unspecified: Secondary | ICD-10-CM

## 2011-02-10 LAB — CBC WITH DIFFERENTIAL/PLATELET
EOS%: 1.4 % (ref 0.0–7.0)
MCH: 31.2 pg (ref 27.2–33.4)
MCHC: 35.4 g/dL (ref 32.0–36.0)
MCV: 88.1 fL (ref 79.3–98.0)
MONO%: 6.8 % (ref 0.0–14.0)
RBC: 4.53 10*6/uL (ref 4.20–5.82)
RDW: 13.5 % (ref 11.0–14.6)

## 2011-02-15 ENCOUNTER — Encounter (HOSPITAL_BASED_OUTPATIENT_CLINIC_OR_DEPARTMENT_OTHER): Payer: BC Managed Care – PPO | Admitting: Oncology

## 2011-02-15 ENCOUNTER — Other Ambulatory Visit: Payer: Self-pay | Admitting: Oncology

## 2011-02-15 DIAGNOSIS — Z5112 Encounter for antineoplastic immunotherapy: Secondary | ICD-10-CM

## 2011-02-15 DIAGNOSIS — D696 Thrombocytopenia, unspecified: Secondary | ICD-10-CM

## 2011-02-15 DIAGNOSIS — D693 Immune thrombocytopenic purpura: Secondary | ICD-10-CM

## 2011-02-15 LAB — CBC WITH DIFFERENTIAL/PLATELET
Eosinophils Absolute: 0.1 10*3/uL (ref 0.0–0.5)
MCV: 89.8 fL (ref 79.3–98.0)
MONO#: 0.4 10*3/uL (ref 0.1–0.9)
MONO%: 4.7 % (ref 0.0–14.0)
NEUT#: 5.8 10*3/uL (ref 1.5–6.5)
RBC: 4.67 10*6/uL (ref 4.20–5.82)
RDW: 13.4 % (ref 11.0–14.6)
WBC: 8.2 10*3/uL (ref 4.0–10.3)

## 2011-02-23 ENCOUNTER — Encounter (HOSPITAL_BASED_OUTPATIENT_CLINIC_OR_DEPARTMENT_OTHER): Payer: BC Managed Care – PPO | Admitting: Oncology

## 2011-02-23 ENCOUNTER — Other Ambulatory Visit: Payer: Self-pay | Admitting: Oncology

## 2011-02-23 DIAGNOSIS — Z23 Encounter for immunization: Secondary | ICD-10-CM

## 2011-02-23 DIAGNOSIS — Z5112 Encounter for antineoplastic immunotherapy: Secondary | ICD-10-CM

## 2011-02-23 DIAGNOSIS — D693 Immune thrombocytopenic purpura: Secondary | ICD-10-CM

## 2011-02-23 LAB — CBC WITH DIFFERENTIAL/PLATELET
BASO%: 0.3 % (ref 0.0–2.0)
Basophils Absolute: 0 10e3/uL (ref 0.0–0.1)
EOS%: 1.4 % (ref 0.0–7.0)
Eosinophils Absolute: 0.1 10e3/uL (ref 0.0–0.5)
HCT: 42.7 % (ref 38.4–49.9)
HGB: 15 g/dL (ref 13.0–17.1)
LYMPH%: 41.6 % (ref 14.0–49.0)
MCH: 30.7 pg (ref 27.2–33.4)
MCHC: 35.1 g/dL (ref 32.0–36.0)
MCV: 87.3 fL (ref 79.3–98.0)
MONO#: 0.5 10e3/uL (ref 0.1–0.9)
MONO%: 7.9 % (ref 0.0–14.0)
NEUT#: 2.8 10e3/uL (ref 1.5–6.5)
NEUT%: 48.8 % (ref 39.0–75.0)
Platelets: 103 10e3/uL — ABNORMAL LOW (ref 140–400)
RBC: 4.89 10e6/uL (ref 4.20–5.82)
RDW: 13.5 % (ref 11.0–14.6)
WBC: 5.8 10e3/uL (ref 4.0–10.3)
lymph#: 2.4 10e3/uL (ref 0.9–3.3)
nRBC: 0 % (ref 0–0)

## 2011-03-03 ENCOUNTER — Other Ambulatory Visit: Payer: Self-pay | Admitting: Oncology

## 2011-03-03 ENCOUNTER — Encounter (HOSPITAL_BASED_OUTPATIENT_CLINIC_OR_DEPARTMENT_OTHER): Payer: BC Managed Care – PPO | Admitting: Oncology

## 2011-03-03 DIAGNOSIS — Z23 Encounter for immunization: Secondary | ICD-10-CM

## 2011-03-03 DIAGNOSIS — Z5112 Encounter for antineoplastic immunotherapy: Secondary | ICD-10-CM

## 2011-03-03 DIAGNOSIS — D693 Immune thrombocytopenic purpura: Secondary | ICD-10-CM

## 2011-03-03 LAB — CBC WITH DIFFERENTIAL/PLATELET
Basophils Absolute: 0 10*3/uL (ref 0.0–0.1)
Eosinophils Absolute: 0.1 10*3/uL (ref 0.0–0.5)
HCT: 39.5 % (ref 38.4–49.9)
HGB: 14.1 g/dL (ref 13.0–17.1)
MONO#: 0.3 10*3/uL (ref 0.1–0.9)
NEUT#: 1.6 10*3/uL (ref 1.5–6.5)
NEUT%: 43.4 % (ref 39.0–75.0)
WBC: 3.7 10*3/uL — ABNORMAL LOW (ref 4.0–10.3)
lymph#: 1.7 10*3/uL (ref 0.9–3.3)

## 2011-03-30 ENCOUNTER — Other Ambulatory Visit: Payer: Self-pay | Admitting: Oncology

## 2011-03-30 ENCOUNTER — Encounter (HOSPITAL_BASED_OUTPATIENT_CLINIC_OR_DEPARTMENT_OTHER): Payer: BC Managed Care – PPO | Admitting: Oncology

## 2011-03-30 DIAGNOSIS — Z23 Encounter for immunization: Secondary | ICD-10-CM

## 2011-03-30 DIAGNOSIS — D693 Immune thrombocytopenic purpura: Secondary | ICD-10-CM

## 2011-03-30 DIAGNOSIS — Z5112 Encounter for antineoplastic immunotherapy: Secondary | ICD-10-CM

## 2011-03-30 LAB — CBC WITH DIFFERENTIAL/PLATELET
Basophils Absolute: 0 10*3/uL (ref 0.0–0.1)
EOS%: 1.6 % (ref 0.0–7.0)
Eosinophils Absolute: 0.1 10*3/uL (ref 0.0–0.5)
HCT: 41.9 % (ref 38.4–49.9)
HGB: 15 g/dL (ref 13.0–17.1)
MCH: 31.1 pg (ref 27.2–33.4)
MONO#: 0.5 10*3/uL (ref 0.1–0.9)
NEUT#: 2 10*3/uL (ref 1.5–6.5)
NEUT%: 43.4 % (ref 39.0–75.0)
RDW: 12.7 % (ref 11.0–14.6)
lymph#: 2 10*3/uL (ref 0.9–3.3)

## 2011-03-31 NOTE — Op Note (Signed)
Richard Scott, Richard Scott                            ACCOUNT NO.:  [id number]   MEDICAL RECORD NO.:  [id number]                   PATIENT TYPE:  AMB   LOCATION:  DSC                                  FACILITY:  [facility]   PHYSICIAN:  Katy Fitch. Naaman Plummer., M.D.          DATE OF BIRTH:  Mar 12, 1956   DATE OF PROCEDURE:  05/27/2004  DATE OF DISCHARGE:                                 OPERATIVE REPORT   PREOPERATIVE DIAGNOSIS:  Status post crushing open fracture of left long  finger, proximal nail matrix, with open fracture of distal phalanx,  sustained five days prior.   POSTOPERATIVE DIAGNOSIS:  Development of pyogenic granuloma due to retained  nail fragment trapped at open fracture site.   OPERATION:  1. Irrigation debridement of pyogenic granuloma of left long finger, distal     phalangeal segment and nail fold.  2. Nail plate removal followed by reconstruction of nail matrix, left long     finger.   OPERATING SURGEON:  Katy Fitch. Sypher, M.D.   ASSISTANT:  Jonni Sanger, P.A.   ANESTHESIA:  Lidocaine 2% and 0.25% Marcaine.  Metacarpal head level block  of left long finger, supplemented with IV sedation.   The supervising anesthesiologist is Dr. Gelene Mink.   INDICATIONS:  Richard Scott is a 55 year old man who is referred for  evaluation and management of a large pyogenic granuloma affecting his left  long finger.  He reported a history of puncturing his nail with a square  drive implement five days prior.  He did not seek immediate medical  attention.   He had developed a rather large pyogenic granuloma.  He was referred for  evaluation and management at [facility].   Clinical exam revealed a pyogenic granuloma the size of a large green PEA.  He maintained full motion of his finger.  X-ray of the finger demonstrated  an open fracture of the distal phalanx with depressed fragments within the  medullary canal.  We advised immediate incision and drainage, biopsy  of the  pyogenic granuloma, followed by nail repair.   PROCEDURE:  Richard Scott is brought to the operating room and placed in  supine position upon the operating room table.  Following light sedation,  the left arm was prepped with Betadine-soaked solution and sterilely draped.  Marcaine 0.25% and 2% lidocaine were infiltrated in the metacarpal head  level to obtain a digital block.  When anesthesia was satisfactory,  procedure commenced with biopsy of the pyogenic granuloma by use of a large  rongeur to simply nip it off.  This was passed off for pathologic  evaluation.   There was an obvious defect in the nail measuring approximately 6 mm x 4 mm  with the depressed nail plate fragment.  The nail was removed with a Market researcher and the wound carefully debrided with the microcurette and small  rongeur.  There was an apparently open  fracture into the distal phalanx.  It  was curetted and debrided.  No attempt was made to reduce the depressed  fracture fragments.  The nail matrix laceration was then repaired with  interrupted suture of 6-0 chromic.  Anatomic repair was achieved followed by  replacement of a cleaned nail plate.   The wound is dressed with Xeroflow sterile gauze and an Alumafoam splint.  For aftercare, Richard Scott is given a prescription for Keflex 500 mg 1 p.o.  q.8h. x4 days as a prophylactic antibiotic.  Also, ibuprofen 600 mg 1 p.o.  q.6h. p.r.n. pain and Ultram 50 mg 1 p.o. q.6h. p.r.n. pain.   Richard Scott was advised to call us prior to his scheduled appointment in 5-7  days should he develop fever, increasing pain, or drainage from his wound.                                               Katy Fitch Naaman Plummer., M.D.    RVS/MEDQ  D:  05/27/2004  T:  05/27/2004  Job:  [id number]

## 2011-04-14 ENCOUNTER — Encounter (HOSPITAL_BASED_OUTPATIENT_CLINIC_OR_DEPARTMENT_OTHER): Payer: BC Managed Care – PPO | Admitting: Oncology

## 2011-04-14 ENCOUNTER — Other Ambulatory Visit: Payer: Self-pay | Admitting: Oncology

## 2011-04-14 DIAGNOSIS — Z23 Encounter for immunization: Secondary | ICD-10-CM

## 2011-04-14 DIAGNOSIS — D693 Immune thrombocytopenic purpura: Secondary | ICD-10-CM

## 2011-04-14 DIAGNOSIS — Z5112 Encounter for antineoplastic immunotherapy: Secondary | ICD-10-CM

## 2011-04-14 LAB — CBC WITH DIFFERENTIAL/PLATELET
Basophils Absolute: 0 10*3/uL (ref 0.0–0.1)
Eosinophils Absolute: 0.1 10*3/uL (ref 0.0–0.5)
HCT: 42.2 % (ref 38.4–49.9)
HGB: 14.9 g/dL (ref 13.0–17.1)
LYMPH%: 42 % (ref 14.0–49.0)
MCV: 90.4 fL (ref 79.3–98.0)
MONO#: 0.3 10*3/uL (ref 0.1–0.9)
MONO%: 7.5 % (ref 0.0–14.0)
NEUT#: 2.2 10*3/uL (ref 1.5–6.5)
Platelets: 100 10*3/uL — ABNORMAL LOW (ref 140–400)
RBC: 4.67 10*6/uL (ref 4.20–5.82)
WBC: 4.5 10*3/uL (ref 4.0–10.3)

## 2011-05-02 ENCOUNTER — Encounter (HOSPITAL_BASED_OUTPATIENT_CLINIC_OR_DEPARTMENT_OTHER): Payer: BC Managed Care – PPO | Admitting: Oncology

## 2011-05-02 ENCOUNTER — Other Ambulatory Visit: Payer: Self-pay | Admitting: Oncology

## 2011-05-02 DIAGNOSIS — Z23 Encounter for immunization: Secondary | ICD-10-CM

## 2011-05-02 DIAGNOSIS — D693 Immune thrombocytopenic purpura: Secondary | ICD-10-CM

## 2011-05-02 DIAGNOSIS — Z5112 Encounter for antineoplastic immunotherapy: Secondary | ICD-10-CM

## 2011-05-02 LAB — CBC WITH DIFFERENTIAL/PLATELET
BASO%: 0.4 % (ref 0.0–2.0)
Eosinophils Absolute: 0.1 10*3/uL (ref 0.0–0.5)
HCT: 43.7 % (ref 38.4–49.9)
LYMPH%: 38 % (ref 14.0–49.0)
MCHC: 35.2 g/dL (ref 32.0–36.0)
MCV: 86.7 fL (ref 79.3–98.0)
MONO#: 0.4 10*3/uL (ref 0.1–0.9)
MONO%: 8.7 % (ref 0.0–14.0)
NEUT%: 51.3 % (ref 39.0–75.0)
Platelets: 87 10*3/uL — ABNORMAL LOW (ref 140–400)
RBC: 5.04 10*6/uL (ref 4.20–5.82)
WBC: 5 10*3/uL (ref 4.0–10.3)

## 2011-05-29 ENCOUNTER — Other Ambulatory Visit: Payer: Self-pay | Admitting: Oncology

## 2011-05-29 ENCOUNTER — Encounter (HOSPITAL_BASED_OUTPATIENT_CLINIC_OR_DEPARTMENT_OTHER): Payer: BC Managed Care – PPO | Admitting: Oncology

## 2011-05-29 DIAGNOSIS — D693 Immune thrombocytopenic purpura: Secondary | ICD-10-CM

## 2011-05-29 DIAGNOSIS — Z5112 Encounter for antineoplastic immunotherapy: Secondary | ICD-10-CM

## 2011-05-29 DIAGNOSIS — Z23 Encounter for immunization: Secondary | ICD-10-CM

## 2011-05-29 DIAGNOSIS — Z5111 Encounter for antineoplastic chemotherapy: Secondary | ICD-10-CM

## 2011-05-29 LAB — CBC WITH DIFFERENTIAL/PLATELET
BASO%: 0.4 % (ref 0.0–2.0)
EOS%: 1.1 % (ref 0.0–7.0)
HCT: 43.5 % (ref 38.4–49.9)
LYMPH%: 39.7 % (ref 14.0–49.0)
MCH: 31 pg (ref 27.2–33.4)
MCHC: 35.9 g/dL (ref 32.0–36.0)
NEUT%: 49.7 % (ref 39.0–75.0)
Platelets: 107 10*3/uL — ABNORMAL LOW (ref 140–400)
RBC: 5.04 10*6/uL (ref 4.20–5.82)
nRBC: 0 % (ref 0–0)

## 2011-05-29 LAB — MORPHOLOGY: PLT EST: DECREASED

## 2011-07-03 ENCOUNTER — Encounter (HOSPITAL_BASED_OUTPATIENT_CLINIC_OR_DEPARTMENT_OTHER): Payer: BC Managed Care – PPO | Admitting: Oncology

## 2011-07-03 ENCOUNTER — Other Ambulatory Visit: Payer: Self-pay | Admitting: Oncology

## 2011-07-03 DIAGNOSIS — D693 Immune thrombocytopenic purpura: Secondary | ICD-10-CM

## 2011-07-03 DIAGNOSIS — Z5111 Encounter for antineoplastic chemotherapy: Secondary | ICD-10-CM

## 2011-07-03 DIAGNOSIS — Z23 Encounter for immunization: Secondary | ICD-10-CM

## 2011-07-03 DIAGNOSIS — Z5112 Encounter for antineoplastic immunotherapy: Secondary | ICD-10-CM

## 2011-07-03 LAB — CBC WITH DIFFERENTIAL/PLATELET
Basophils Absolute: 0 10*3/uL (ref 0.0–0.1)
EOS%: 2 % (ref 0.0–7.0)
HCT: 44.5 % (ref 38.4–49.9)
HGB: 16 g/dL (ref 13.0–17.1)
MCH: 31.2 pg (ref 27.2–33.4)
MONO#: 0.4 10*3/uL (ref 0.1–0.9)
NEUT#: 2.3 10*3/uL (ref 1.5–6.5)
NEUT%: 46.2 % (ref 39.0–75.0)
RDW: 12.8 % (ref 11.0–14.6)
WBC: 5 10*3/uL (ref 4.0–10.3)
lymph#: 2.2 10*3/uL (ref 0.9–3.3)

## 2011-07-03 LAB — CHCC SMEAR

## 2011-07-03 LAB — MORPHOLOGY

## 2011-07-24 ENCOUNTER — Encounter (HOSPITAL_BASED_OUTPATIENT_CLINIC_OR_DEPARTMENT_OTHER): Payer: BC Managed Care – PPO | Admitting: Oncology

## 2011-07-24 ENCOUNTER — Other Ambulatory Visit: Payer: Self-pay | Admitting: Oncology

## 2011-07-24 DIAGNOSIS — Z23 Encounter for immunization: Secondary | ICD-10-CM

## 2011-07-24 DIAGNOSIS — Z5112 Encounter for antineoplastic immunotherapy: Secondary | ICD-10-CM

## 2011-07-24 DIAGNOSIS — Z5111 Encounter for antineoplastic chemotherapy: Secondary | ICD-10-CM

## 2011-07-24 DIAGNOSIS — D693 Immune thrombocytopenic purpura: Secondary | ICD-10-CM

## 2011-07-24 LAB — CBC WITH DIFFERENTIAL/PLATELET
BASO%: 0.4 % (ref 0.0–2.0)
LYMPH%: 44.3 % (ref 14.0–49.0)
MCHC: 36 g/dL (ref 32.0–36.0)
MCV: 85.8 fL (ref 79.3–98.0)
MONO#: 0.4 10*3/uL (ref 0.1–0.9)
MONO%: 8.7 % (ref 0.0–14.0)
NEUT#: 2.2 10*3/uL (ref 1.5–6.5)
Platelets: 101 10*3/uL — ABNORMAL LOW (ref 140–400)
RBC: 5.15 10*6/uL (ref 4.20–5.82)
RDW: 12.5 % (ref 11.0–14.6)
WBC: 4.9 10*3/uL (ref 4.0–10.3)
nRBC: 0 % (ref 0–0)

## 2011-07-24 LAB — CHCC SMEAR

## 2011-07-24 LAB — MORPHOLOGY

## 2011-09-18 ENCOUNTER — Other Ambulatory Visit: Payer: Self-pay | Admitting: Oncology

## 2011-09-18 ENCOUNTER — Other Ambulatory Visit (HOSPITAL_BASED_OUTPATIENT_CLINIC_OR_DEPARTMENT_OTHER): Payer: BC Managed Care – PPO | Admitting: Lab

## 2011-09-18 DIAGNOSIS — D693 Immune thrombocytopenic purpura: Secondary | ICD-10-CM

## 2011-09-18 DIAGNOSIS — Z5112 Encounter for antineoplastic immunotherapy: Secondary | ICD-10-CM

## 2011-09-18 DIAGNOSIS — Z5111 Encounter for antineoplastic chemotherapy: Secondary | ICD-10-CM

## 2011-09-18 DIAGNOSIS — Z23 Encounter for immunization: Secondary | ICD-10-CM

## 2011-09-18 LAB — CBC WITH DIFFERENTIAL/PLATELET
BASO%: 0.8 % (ref 0.0–2.0)
Eosinophils Absolute: 0.1 10*3/uL (ref 0.0–0.5)
MONO#: 0.5 10*3/uL (ref 0.1–0.9)
NEUT#: 2 10*3/uL (ref 1.5–6.5)
Platelets: 93 10*3/uL — ABNORMAL LOW (ref 140–400)
RBC: 5.26 10*6/uL (ref 4.20–5.82)
RDW: 12.7 % (ref 11.0–14.6)
WBC: 5.2 10*3/uL (ref 4.0–10.3)
lymph#: 2.6 10*3/uL (ref 0.9–3.3)

## 2011-10-14 ENCOUNTER — Telehealth: Payer: Self-pay | Admitting: Oncology

## 2011-10-14 NOTE — Telephone Encounter (Signed)
per pts request mailed his appts for [id number] to his home today on 12/01

## 2011-10-16 ENCOUNTER — Other Ambulatory Visit: Payer: Self-pay | Admitting: Oncology

## 2011-10-16 ENCOUNTER — Other Ambulatory Visit (HOSPITAL_BASED_OUTPATIENT_CLINIC_OR_DEPARTMENT_OTHER): Payer: BC Managed Care – PPO | Admitting: Lab

## 2011-10-16 DIAGNOSIS — D693 Immune thrombocytopenic purpura: Secondary | ICD-10-CM

## 2011-10-16 DIAGNOSIS — Z23 Encounter for immunization: Secondary | ICD-10-CM

## 2011-10-16 DIAGNOSIS — Z5111 Encounter for antineoplastic chemotherapy: Secondary | ICD-10-CM

## 2011-10-16 DIAGNOSIS — Z5112 Encounter for antineoplastic immunotherapy: Secondary | ICD-10-CM

## 2011-10-16 LAB — CBC WITH DIFFERENTIAL/PLATELET
Basophils Absolute: 0 10*3/uL (ref 0.0–0.1)
Eosinophils Absolute: 0.1 10*3/uL (ref 0.0–0.5)
HGB: 16.1 g/dL (ref 13.0–17.1)
MCV: 86.6 fL (ref 79.3–98.0)
MONO%: 9.5 % (ref 0.0–14.0)
NEUT#: 2.3 10*3/uL (ref 1.5–6.5)
Platelets: 100 10*3/uL — ABNORMAL LOW (ref 140–400)
RDW: 12.6 % (ref 11.0–14.6)

## 2011-10-16 LAB — MORPHOLOGY: PLT EST: DECREASED

## 2011-10-16 LAB — CHCC SMEAR

## 2011-10-30 IMAGING — CR DG CHEST 1V PORT
1 series · 1 of 1 positions shown · non-contrast
Comparison: 02/27/2010

CLINICAL DATA: Central line placement

PORTABLE CHEST - 1 VIEW

[view not recorded]
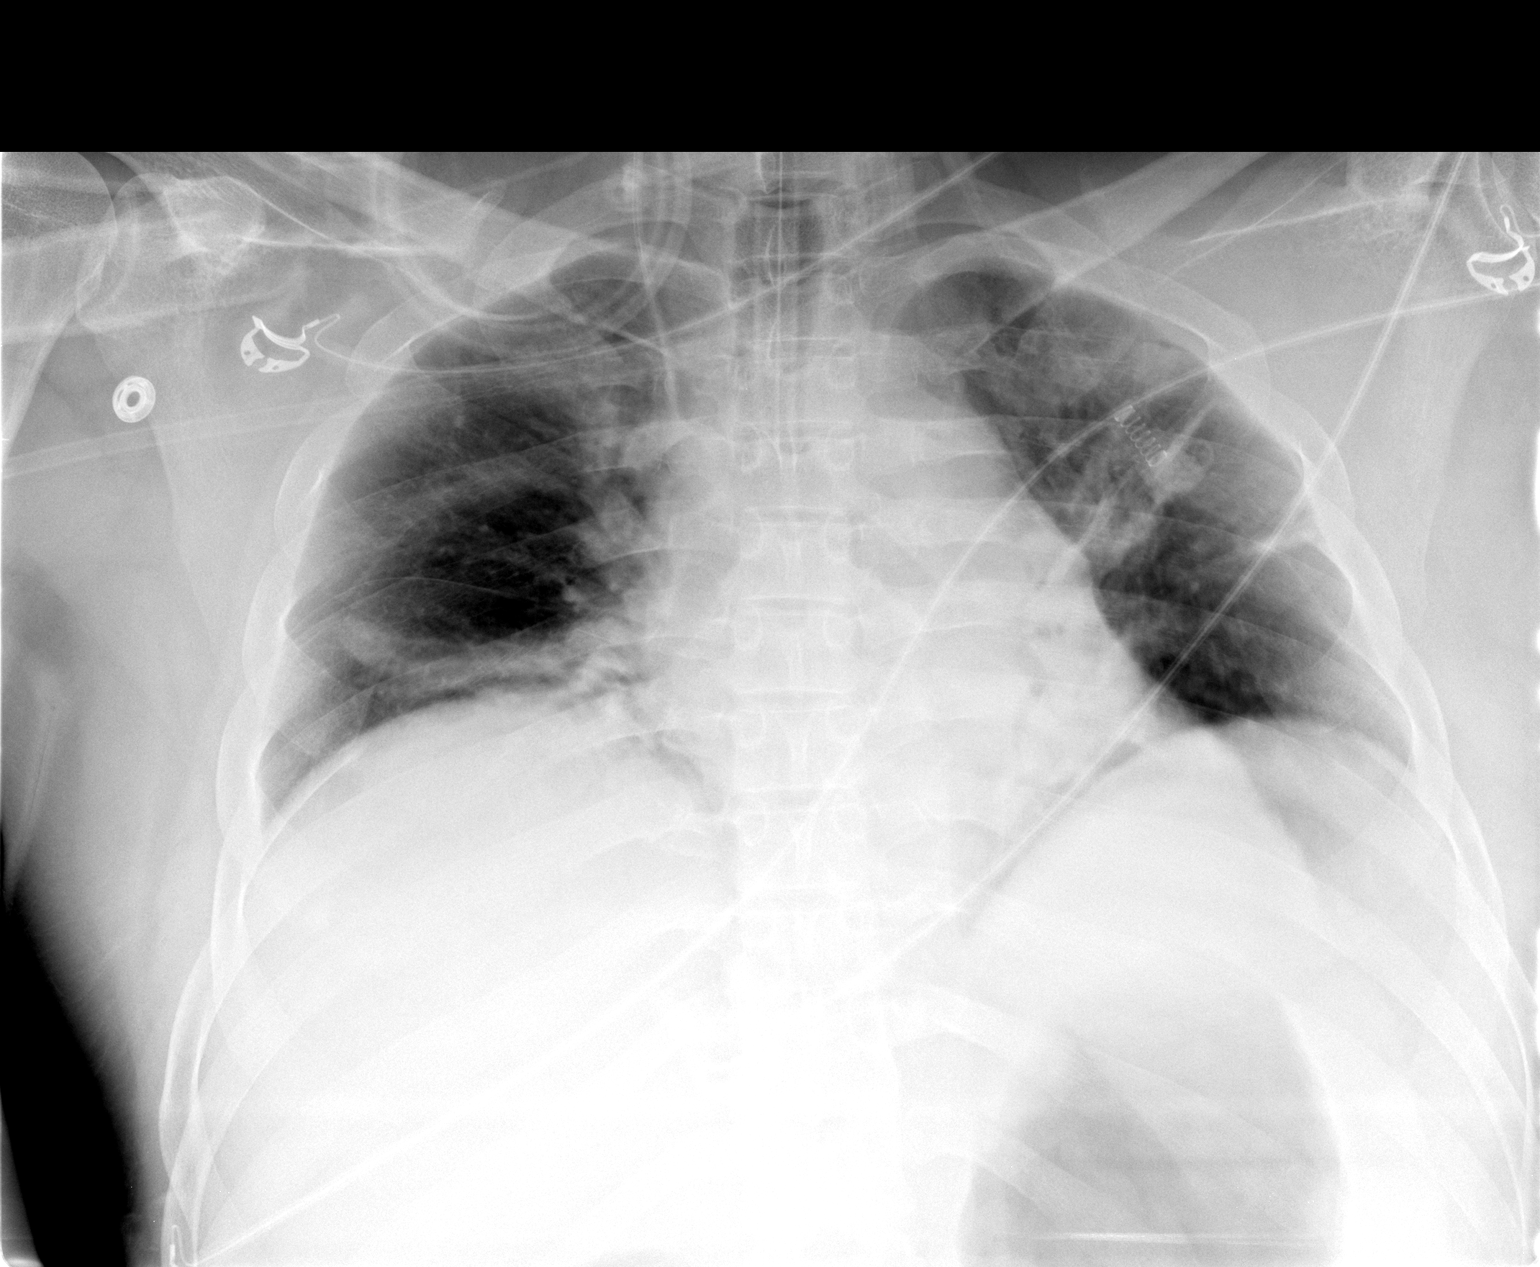

[1 of 1 positions shown; findings below may reference images not displayed]

FINDINGS: New right IJ central line tip in the upper SVC.  This
could be advanced 4 cm into the SVC RA junction.  Endotracheal tube
2 cm above the carina.  Low lung volumes persist with scattered
perihilar and lower lobe atelectasis.  No effusion or pneumothorax.
IMPRESSION: New right IJ central line tip upper SVC.

## 2011-10-30 IMAGING — CT CT ANGIO CHEST
2 of 7 series · 17 of 36 positions shown · IV contrast (APPLIED)
Comparison: Plain film chest of earlier today.  No prior CT.

CLINICAL DATA: Altered mental status.  Sepsis.  Diarrhea.
Hypertension.  Right-sided rib pain.  Rule out pulmonary embolism
or dissection.

CT ANGIOGRAPHY OF THE CHEST
TECHNIQUE: Multidetector CT angiography of the chest was performed
after contrast with bolus timed to evaluate the pulmonary arteries.
Multiplanar CT image reconstructions including MIPs were obtained
to evaluate the vascular anatomy.
Contrast:  100 ml Omnipaque 350
TECHNIQUE: Multidetector CT imaging of the abdomen and pelvis was
performed using the standard protocol during bolus administration
of intravenous contrast.  Multiplanar reconstructed images
including MIPs were obtained and reviewed to evaluate the vascular
anatomy.
CTA ABDOMEN

[Series 5: pe 1.0 b20f st · axial · 0.67mm/px · z∈[-484,+78]mm · 16 of 634 slices shown]
[im 36/634  lung]
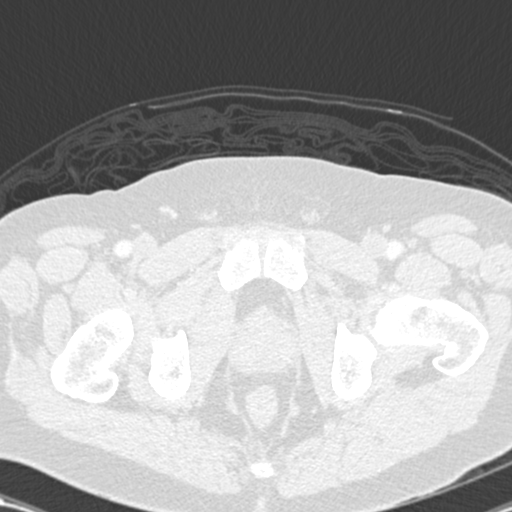
[im 71/634  mediastinal]
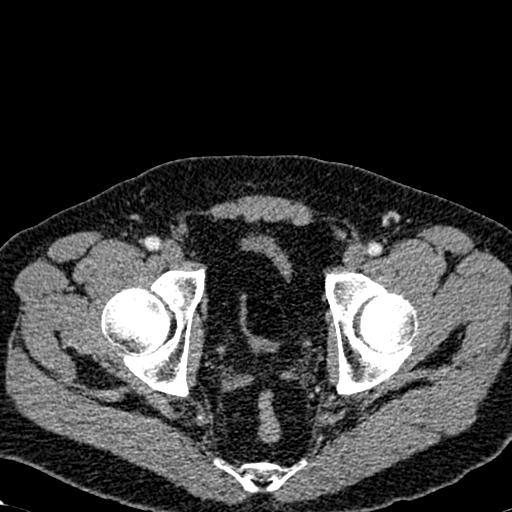
[im 106/634  lung]
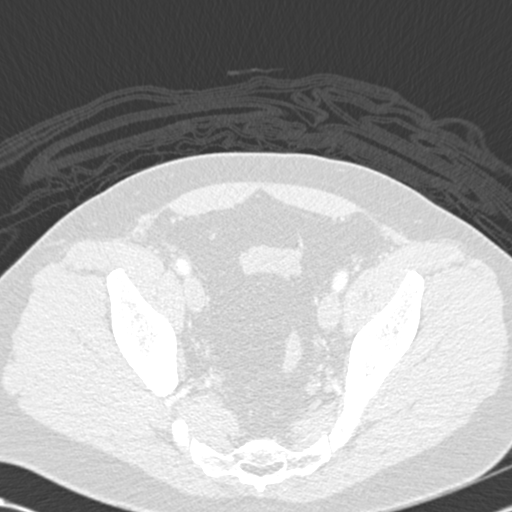
[im 141/634  mediastinal]
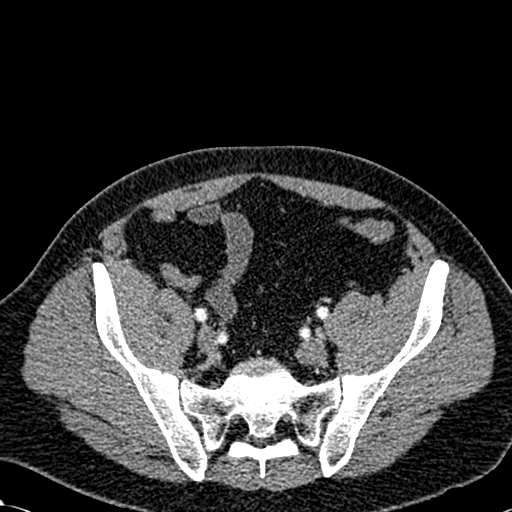
[im 176/634  lung]
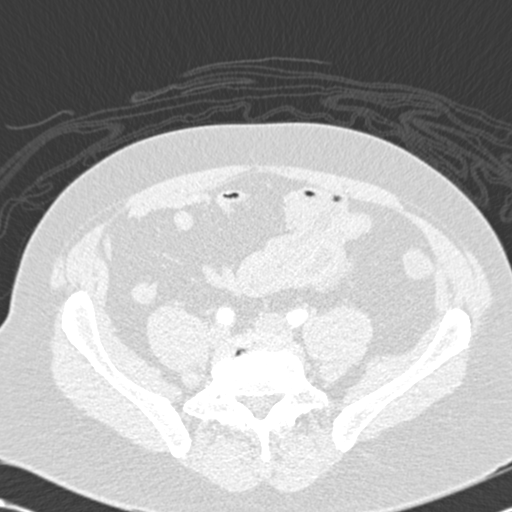
[im 212/634  mediastinal]
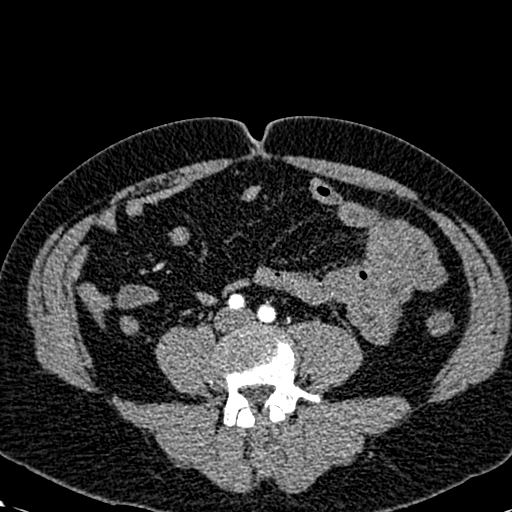
[im 247/634  lung]
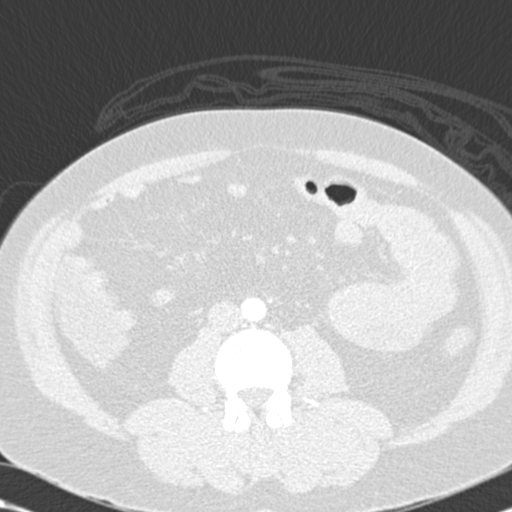
[im 282/634  mediastinal]
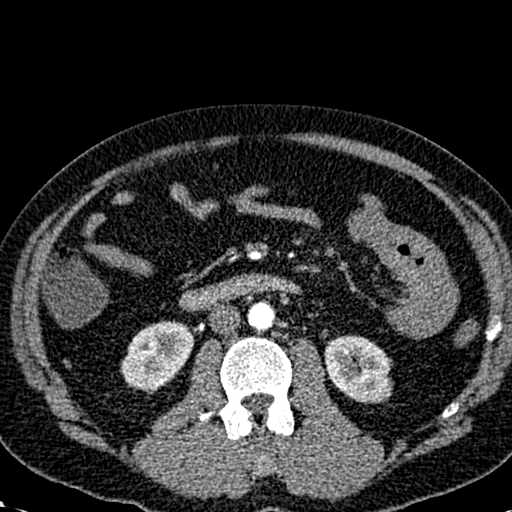
[im 352/634  lung]
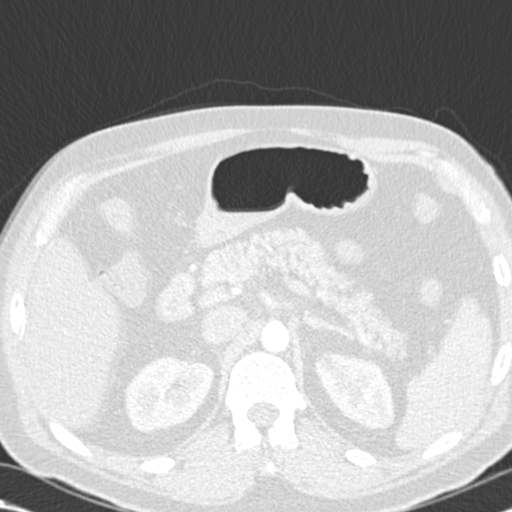
[im 387/634  mediastinal]
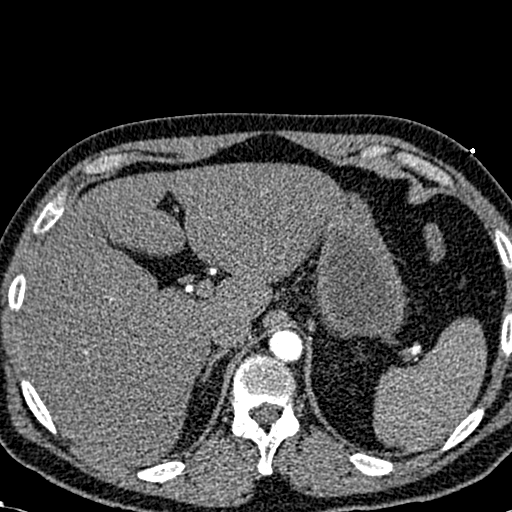
[im 423/634  lung]
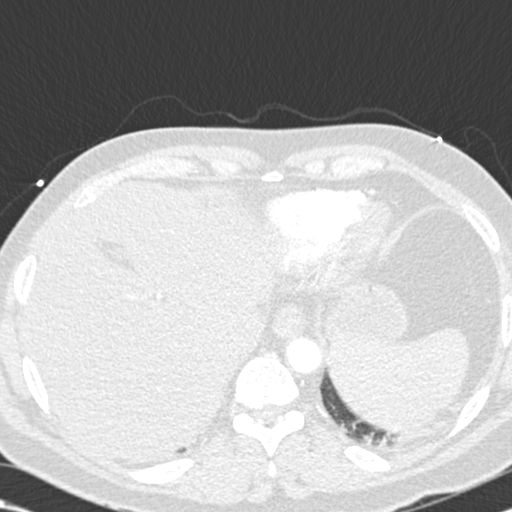
[im 458/634  mediastinal]
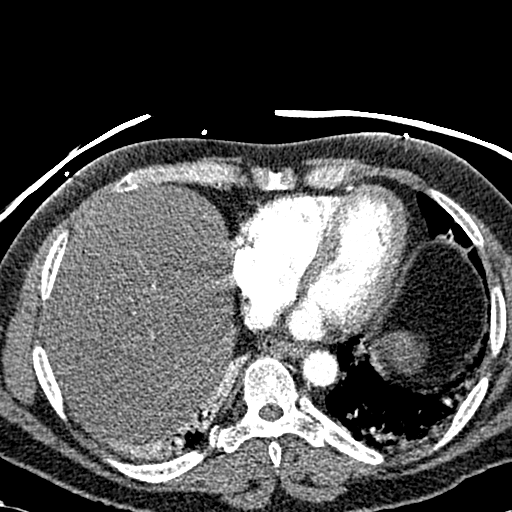
[im 493/634  lung]
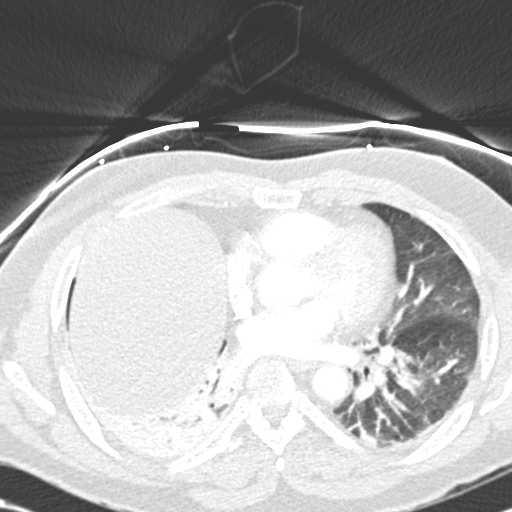
[im 528/634  mediastinal]
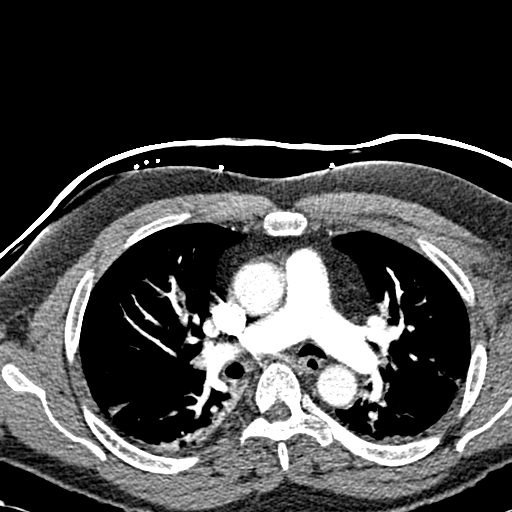
[im 563/634  lung]
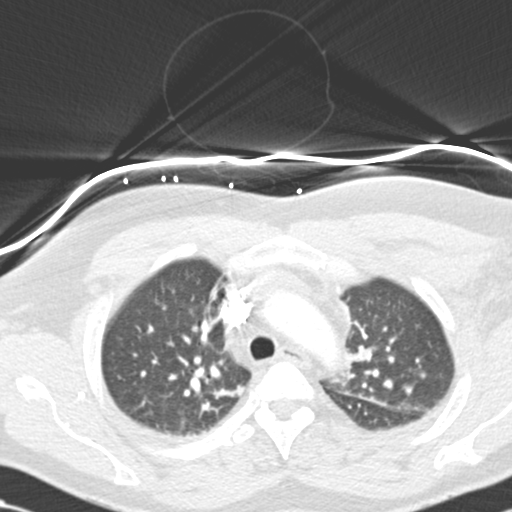
[im 598/634  mediastinal]
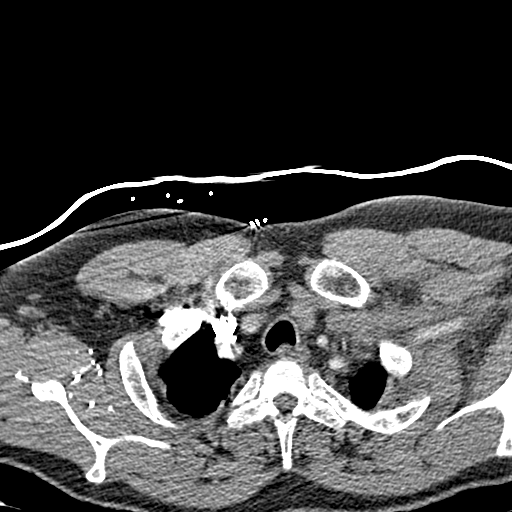

[Series 602: coronal · coronal · 1.24mm/px · 1 of 127 slices shown]
[im 64/127  mediastinal]
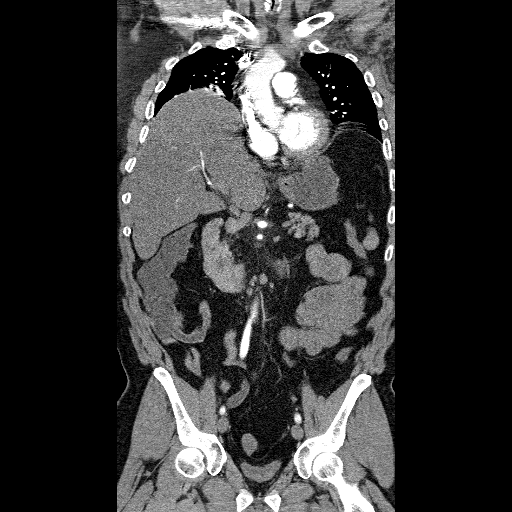

[17 of 36 positions shown; findings below may reference images not displayed]

FINDINGS: Lung windows demonstrate patent airways, including to the
right lower lobe.  Mild motion degradation throughout.  Right
middle lobe and left lower lobe subsegmental atelectasis.

Collapse / consolidative change in the right lower lobe, suspicious
for infection.  Example image 43 of series 6.

Soft tissue windows:  The quality of this exam for evaluation of
pulmonary embolism is moderate.  The bolus as well timed.  The
primary limitation is motion artifact.  Given the motion artifact,
no evidence of pulmonary embolism to the large segmental level.

Extensive edema throughout the left subpectoral axillary regions
including on image 14 of series 4.  No fluid collection identified.

Normal aortic caliber without dissection.  Normal heart size
without pericardial or pleural effusion.  No mediastinal or hilar
adenopathy.

 Review of the MIP images confirms the above findings.
IMPRESSION: 1.  Mild motion degradation.  Given this factor, no evidence of
pulmonary embolism.
2.  Right lower lobe airspace disease, suspicious for pneumonia.
3.  Nonspecific edema within the left axilla and subpectoral
region.  Recommend physical exam correlation.  Considerations
include cellulitis or thrombus within the axillary/subclavian vein.
Correlate with any attempted catheter placement area

CT ANGIOGRAPHY ABDOMEN AND PELVIS
FINDINGS: Moderate fatty infiltration of the liver with
enlargement of the right lobe.  Mild motion degradation continues
in the abdomen.  Normal stomach, pancreas.  The gallbladder is
partially contracted.  No calcified stone or biliary ductal
dilatation.  Splenomegaly, at 15.3 cm cranial caudal.

Normal adrenal glands.  Interpolar right renal calculus.  Too small
to characterize upper pole left renal lesion.

Superior celiac axis and superior mesenteric artery both widely
patent.  Accessory right renal artery.  Renal artery is patent.
Normal abdominal aorta normal caliber abdominal aorta with mild
atherosclerosis but no aneurysm.  Inferior mesenteric artery widely
patent.

Small retroperitoneal lymph nodes but no adenopathy.  Porta hepatis
lymph nodes prominent likely reactive.

Colonic diverticula. Normal terminal ileum and appendix.  Apparent
wall thickening of jejunal bowel loops on image 119 of series 4 is
felt to be secondary to underdistension and mild concurrent motion
artifact.  No bowel obstruction.  No ascites.

 Review of the MIP images confirms the above findings.
IMPRESSION: 1.  No acute process involving the abdominal aorta/branch vessels.
2.  Fatty infiltration of the liver is moderate.
3.  Motion degradation.

CTA PELVIS
FINDINGS: Motion degradation continues in the pelvis.  Normal
caliber of pelvic vasculature with patent internal iliac arteries
bilaterally.

Sigmoid diverticulosis.  Normal pelvic small bowel. No pelvic
adenopathy.    Normal urinary bladder with mild prostatomegaly. No
significant free fluid.  Degenerative disease at the L4-L5 level.

 Review of the MIP images confirms the above findings.
IMPRESSION: 1. No acute pelvic process.
2.  Mild prostatomegaly.

## 2011-10-30 IMAGING — CR DG CHEST 1V PORT
1 series · 1 of 1 positions shown · non-contrast
Comparison: None.

CLINICAL DATA: Altered mental status

PORTABLE CHEST - 1 VIEW

[view not recorded]
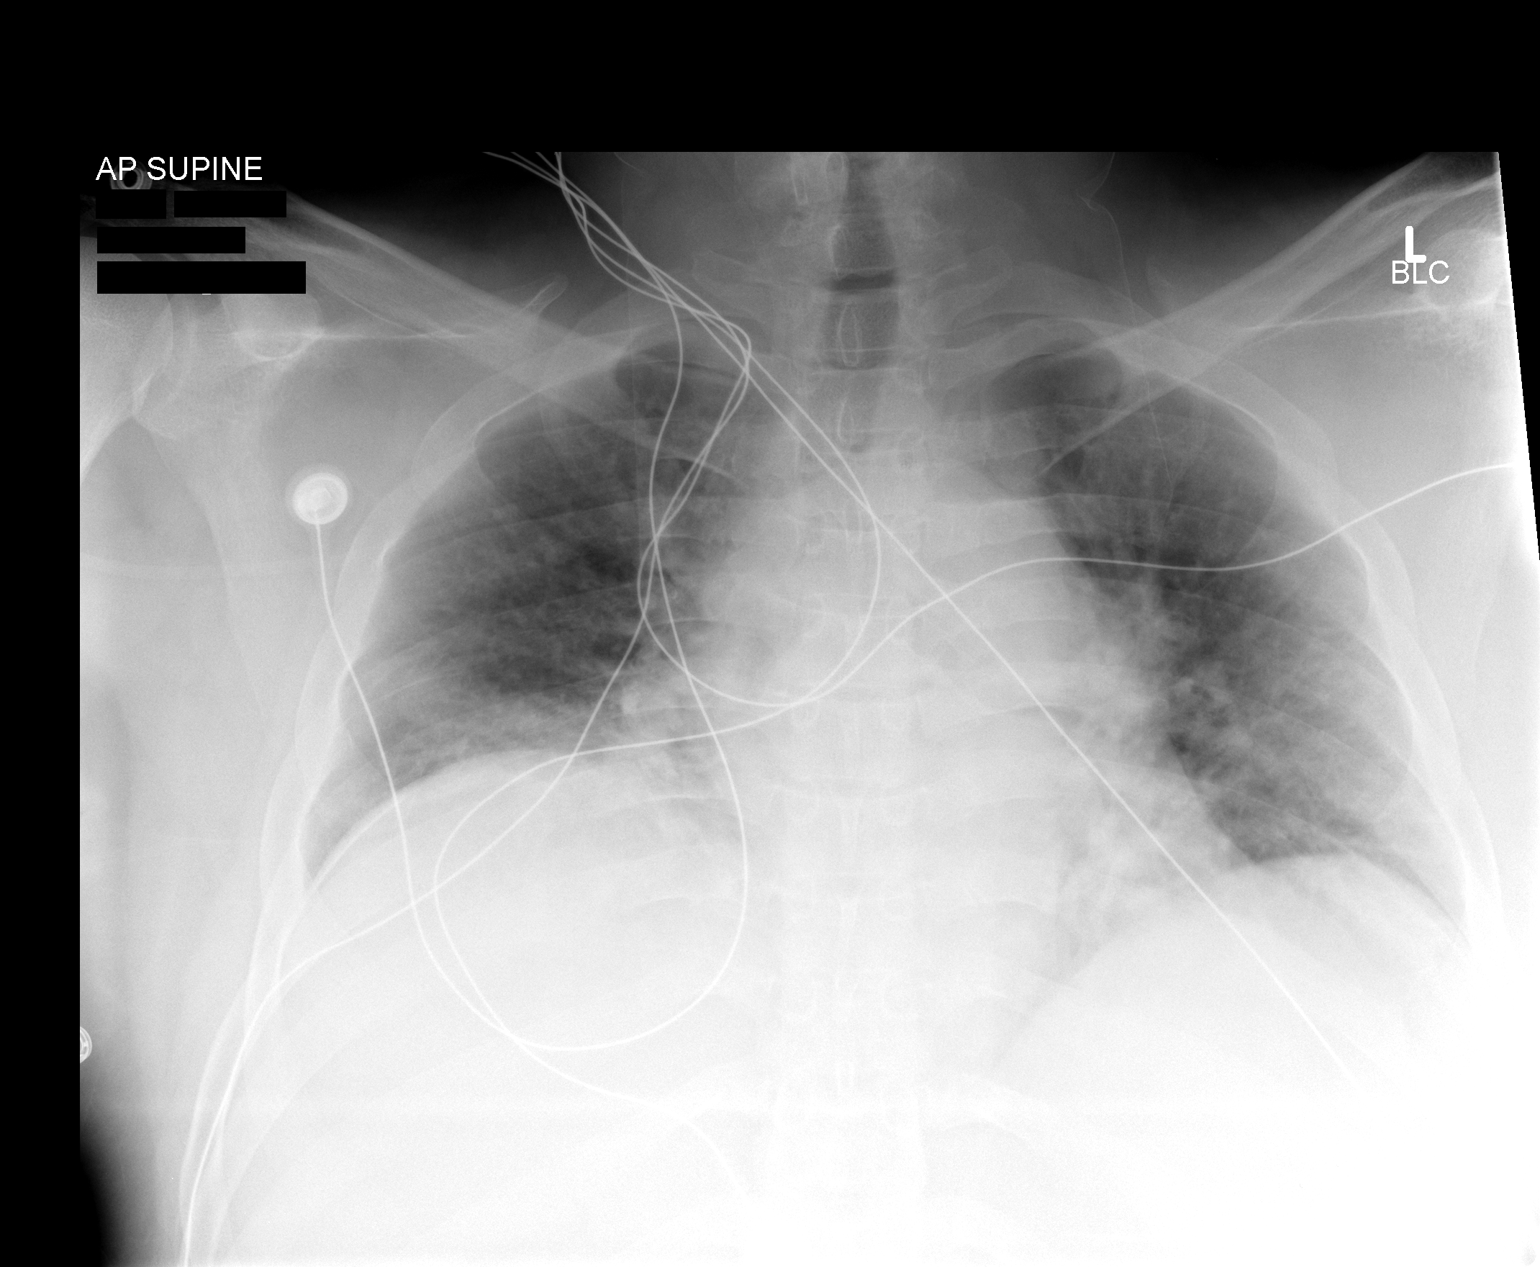

[1 of 1 positions shown; findings below may reference images not displayed]

FINDINGS: Low volume lungs.  Right base subsegmental atelectasis.
Suggestion of pulmonary vascular congestion. Probable chronic
bronchitic changes.  No pulmonary edema or pneumonia.
IMPRESSION: Right base atelectasis.  Chronic bronchitic changes. Probable
pulmonary vascular congestion without edema.

## 2011-11-13 ENCOUNTER — Other Ambulatory Visit (HOSPITAL_BASED_OUTPATIENT_CLINIC_OR_DEPARTMENT_OTHER): Payer: BC Managed Care – PPO | Admitting: Lab

## 2011-11-13 ENCOUNTER — Other Ambulatory Visit: Payer: Self-pay | Admitting: Oncology

## 2011-11-13 DIAGNOSIS — Z23 Encounter for immunization: Secondary | ICD-10-CM

## 2011-11-13 DIAGNOSIS — D693 Immune thrombocytopenic purpura: Secondary | ICD-10-CM

## 2011-11-13 DIAGNOSIS — Z5111 Encounter for antineoplastic chemotherapy: Secondary | ICD-10-CM

## 2011-11-13 DIAGNOSIS — Z5112 Encounter for antineoplastic immunotherapy: Secondary | ICD-10-CM

## 2011-11-13 LAB — CBC WITH DIFFERENTIAL/PLATELET
BASO%: 0.4 % (ref 0.0–2.0)
HCT: 43.5 % (ref 38.4–49.9)
MCHC: 35.7 g/dL (ref 32.0–36.0)
MONO#: 0.3 10*3/uL (ref 0.1–0.9)
NEUT%: 54.4 % (ref 39.0–75.0)
RBC: 4.87 10*6/uL (ref 4.20–5.82)
RDW: 12.5 % (ref 11.0–14.6)
WBC: 4.3 10*3/uL (ref 4.0–10.3)
lymph#: 1.6 10*3/uL (ref 0.9–3.3)

## 2011-11-13 LAB — MORPHOLOGY
PLT EST: DECREASED
RBC Comments: NORMAL

## 2011-12-11 ENCOUNTER — Other Ambulatory Visit: Payer: BC Managed Care – PPO

## 2012-01-08 ENCOUNTER — Other Ambulatory Visit (HOSPITAL_BASED_OUTPATIENT_CLINIC_OR_DEPARTMENT_OTHER): Payer: BC Managed Care – PPO | Admitting: Lab

## 2012-01-08 DIAGNOSIS — D693 Immune thrombocytopenic purpura: Secondary | ICD-10-CM

## 2012-01-08 DIAGNOSIS — Z5112 Encounter for antineoplastic immunotherapy: Secondary | ICD-10-CM

## 2012-01-08 DIAGNOSIS — Z23 Encounter for immunization: Secondary | ICD-10-CM

## 2012-01-08 DIAGNOSIS — Z5111 Encounter for antineoplastic chemotherapy: Secondary | ICD-10-CM

## 2012-01-08 LAB — CBC WITH DIFFERENTIAL/PLATELET
BASO%: 0.7 % (ref 0.0–2.0)
EOS%: 1.3 % (ref 0.0–7.0)
HCT: 46.5 % (ref 38.4–49.9)
LYMPH%: 41 % (ref 14.0–49.0)
MCH: 31.4 pg (ref 27.2–33.4)
MCHC: 34.9 g/dL (ref 32.0–36.0)
MCV: 89.9 fL (ref 79.3–98.0)
MONO#: 0.3 10*3/uL (ref 0.1–0.9)
MONO%: 6.4 % (ref 0.0–14.0)
NEUT%: 50.6 % (ref 39.0–75.0)
Platelets: 96 10*3/uL — ABNORMAL LOW (ref 140–400)

## 2012-01-08 LAB — MORPHOLOGY: PLT EST: DECREASED

## 2012-02-05 ENCOUNTER — Other Ambulatory Visit (HOSPITAL_BASED_OUTPATIENT_CLINIC_OR_DEPARTMENT_OTHER): Payer: BC Managed Care – PPO | Admitting: Lab

## 2012-02-05 DIAGNOSIS — Z5112 Encounter for antineoplastic immunotherapy: Secondary | ICD-10-CM

## 2012-02-05 DIAGNOSIS — D693 Immune thrombocytopenic purpura: Secondary | ICD-10-CM

## 2012-02-05 DIAGNOSIS — Z23 Encounter for immunization: Secondary | ICD-10-CM

## 2012-02-05 DIAGNOSIS — Z5111 Encounter for antineoplastic chemotherapy: Secondary | ICD-10-CM

## 2012-02-05 LAB — CBC WITH DIFFERENTIAL/PLATELET
Basophils Absolute: 0 10*3/uL (ref 0.0–0.1)
EOS%: 1.2 % (ref 0.0–7.0)
HCT: 47 % (ref 38.4–49.9)
HGB: 16.9 g/dL (ref 13.0–17.1)
MCH: 30.8 pg (ref 27.2–33.4)
NEUT%: 50.9 % (ref 39.0–75.0)
lymph#: 2.4 10*3/uL (ref 0.9–3.3)

## 2012-02-05 LAB — MORPHOLOGY

## 2012-03-04 ENCOUNTER — Other Ambulatory Visit: Payer: BC Managed Care – PPO | Admitting: Lab

## 2012-04-01 ENCOUNTER — Other Ambulatory Visit: Payer: BC Managed Care – PPO | Admitting: Lab

## 2012-04-29 ENCOUNTER — Other Ambulatory Visit: Payer: BC Managed Care – PPO | Admitting: Lab

## 2012-04-29 ENCOUNTER — Ambulatory Visit: Payer: BC Managed Care – PPO | Admitting: Oncology

## 2016-03-29 DIAGNOSIS — D693 Immune thrombocytopenic purpura: Secondary | ICD-10-CM | POA: Diagnosis not present

## 2016-03-29 DIAGNOSIS — J309 Allergic rhinitis, unspecified: Secondary | ICD-10-CM | POA: Diagnosis not present

## 2016-03-29 DIAGNOSIS — I10 Essential (primary) hypertension: Secondary | ICD-10-CM | POA: Diagnosis not present

## 2016-03-29 DIAGNOSIS — E785 Hyperlipidemia, unspecified: Secondary | ICD-10-CM | POA: Diagnosis not present

## 2016-03-29 DIAGNOSIS — E291 Testicular hypofunction: Secondary | ICD-10-CM | POA: Diagnosis not present

## 2016-04-12 DIAGNOSIS — E291 Testicular hypofunction: Secondary | ICD-10-CM | POA: Diagnosis not present

## 2016-04-12 DIAGNOSIS — R944 Abnormal results of kidney function studies: Secondary | ICD-10-CM | POA: Diagnosis not present

## 2016-04-12 DIAGNOSIS — D582 Other hemoglobinopathies: Secondary | ICD-10-CM | POA: Diagnosis not present

## 2016-05-23 DIAGNOSIS — E291 Testicular hypofunction: Secondary | ICD-10-CM | POA: Diagnosis not present

## 2016-06-22 DIAGNOSIS — L814 Other melanin hyperpigmentation: Secondary | ICD-10-CM | POA: Diagnosis not present

## 2016-06-22 DIAGNOSIS — L570 Actinic keratosis: Secondary | ICD-10-CM | POA: Diagnosis not present

## 2016-06-22 DIAGNOSIS — L219 Seborrheic dermatitis, unspecified: Secondary | ICD-10-CM | POA: Diagnosis not present

## 2016-06-22 DIAGNOSIS — D225 Melanocytic nevi of trunk: Secondary | ICD-10-CM | POA: Diagnosis not present

## 2016-06-22 DIAGNOSIS — L821 Other seborrheic keratosis: Secondary | ICD-10-CM | POA: Diagnosis not present

## 2016-06-23 DIAGNOSIS — E291 Testicular hypofunction: Secondary | ICD-10-CM | POA: Diagnosis not present

## 2016-07-27 DIAGNOSIS — E291 Testicular hypofunction: Secondary | ICD-10-CM | POA: Diagnosis not present

## 2016-07-28 DIAGNOSIS — M25561 Pain in right knee: Secondary | ICD-10-CM | POA: Diagnosis not present

## 2016-07-28 DIAGNOSIS — M238X1 Other internal derangements of right knee: Secondary | ICD-10-CM | POA: Diagnosis not present

## 2016-08-08 DIAGNOSIS — M25561 Pain in right knee: Secondary | ICD-10-CM | POA: Diagnosis not present

## 2016-08-24 DIAGNOSIS — S83281A Other tear of lateral meniscus, current injury, right knee, initial encounter: Secondary | ICD-10-CM | POA: Diagnosis not present

## 2016-08-24 DIAGNOSIS — M25561 Pain in right knee: Secondary | ICD-10-CM | POA: Diagnosis not present

## 2016-08-28 DIAGNOSIS — E291 Testicular hypofunction: Secondary | ICD-10-CM | POA: Diagnosis not present

## 2016-09-25 DIAGNOSIS — Z23 Encounter for immunization: Secondary | ICD-10-CM | POA: Diagnosis not present

## 2016-09-29 DIAGNOSIS — E291 Testicular hypofunction: Secondary | ICD-10-CM | POA: Diagnosis not present

## 2016-10-11 DIAGNOSIS — I10 Essential (primary) hypertension: Secondary | ICD-10-CM | POA: Diagnosis not present

## 2016-10-30 DIAGNOSIS — E291 Testicular hypofunction: Secondary | ICD-10-CM | POA: Diagnosis not present

## 2016-12-11 DIAGNOSIS — E291 Testicular hypofunction: Secondary | ICD-10-CM | POA: Diagnosis not present

## 2016-12-25 DIAGNOSIS — J069 Acute upper respiratory infection, unspecified: Secondary | ICD-10-CM | POA: Diagnosis not present

## 2016-12-25 DIAGNOSIS — R109 Unspecified abdominal pain: Secondary | ICD-10-CM | POA: Diagnosis not present

## 2017-01-08 DIAGNOSIS — R109 Unspecified abdominal pain: Secondary | ICD-10-CM | POA: Diagnosis not present

## 2017-01-08 DIAGNOSIS — E291 Testicular hypofunction: Secondary | ICD-10-CM | POA: Diagnosis not present

## 2017-01-10 ENCOUNTER — Other Ambulatory Visit: Payer: Self-pay | Admitting: Family Medicine

## 2017-01-10 DIAGNOSIS — R1084 Generalized abdominal pain: Secondary | ICD-10-CM

## 2017-01-16 ENCOUNTER — Ambulatory Visit
Admission: RE | Admit: 2017-01-16 | Discharge: 2017-01-16 | Disposition: A | Discharge status: Home / Self Care | Payer: BLUE CROSS/BLUE SHIELD | Source: Ambulatory Visit | Attending: Family Medicine | Admitting: Family Medicine

## 2017-01-16 ENCOUNTER — Other Ambulatory Visit: Payer: Self-pay | Admitting: Family Medicine

## 2017-01-16 DIAGNOSIS — R1084 Generalized abdominal pain: Secondary | ICD-10-CM

## 2017-01-16 MED ORDER — IOPAMIDOL (ISOVUE-300) INJECTION 61%
125.0000 mL | Freq: Once | INTRAVENOUS | Status: AC | PRN
  Administered 2017-01-16: 125 mL via INTRAVENOUS

## 2017-02-05 DIAGNOSIS — E291 Testicular hypofunction: Secondary | ICD-10-CM | POA: Diagnosis not present

## 2017-03-12 DIAGNOSIS — E785 Hyperlipidemia, unspecified: Secondary | ICD-10-CM | POA: Diagnosis not present

## 2017-03-12 DIAGNOSIS — J309 Allergic rhinitis, unspecified: Secondary | ICD-10-CM | POA: Diagnosis not present

## 2017-03-12 DIAGNOSIS — E291 Testicular hypofunction: Secondary | ICD-10-CM | POA: Diagnosis not present

## 2017-03-12 DIAGNOSIS — I10 Essential (primary) hypertension: Secondary | ICD-10-CM | POA: Diagnosis not present

## 2017-03-13 DIAGNOSIS — E785 Hyperlipidemia, unspecified: Secondary | ICD-10-CM | POA: Diagnosis not present

## 2017-03-13 DIAGNOSIS — E291 Testicular hypofunction: Secondary | ICD-10-CM | POA: Diagnosis not present

## 2017-03-13 DIAGNOSIS — I10 Essential (primary) hypertension: Secondary | ICD-10-CM | POA: Diagnosis not present

## 2017-04-13 DIAGNOSIS — E291 Testicular hypofunction: Secondary | ICD-10-CM | POA: Diagnosis not present

## 2017-04-13 DIAGNOSIS — I10 Essential (primary) hypertension: Secondary | ICD-10-CM | POA: Diagnosis not present

## 2017-05-03 DIAGNOSIS — J209 Acute bronchitis, unspecified: Secondary | ICD-10-CM | POA: Diagnosis not present

## 2017-05-14 DIAGNOSIS — E291 Testicular hypofunction: Secondary | ICD-10-CM | POA: Diagnosis not present

## 2017-06-15 DIAGNOSIS — E291 Testicular hypofunction: Secondary | ICD-10-CM | POA: Diagnosis not present

## 2017-06-19 DIAGNOSIS — D485 Neoplasm of uncertain behavior of skin: Secondary | ICD-10-CM | POA: Diagnosis not present

## 2017-06-19 DIAGNOSIS — C44311 Basal cell carcinoma of skin of nose: Secondary | ICD-10-CM | POA: Diagnosis not present

## 2017-07-20 DIAGNOSIS — E291 Testicular hypofunction: Secondary | ICD-10-CM | POA: Diagnosis not present

## 2017-08-13 DIAGNOSIS — J309 Allergic rhinitis, unspecified: Secondary | ICD-10-CM | POA: Diagnosis not present

## 2017-08-13 DIAGNOSIS — G4700 Insomnia, unspecified: Secondary | ICD-10-CM | POA: Diagnosis not present

## 2017-08-13 DIAGNOSIS — I10 Essential (primary) hypertension: Secondary | ICD-10-CM | POA: Diagnosis not present

## 2017-08-13 DIAGNOSIS — E291 Testicular hypofunction: Secondary | ICD-10-CM | POA: Diagnosis not present

## 2017-08-20 DIAGNOSIS — E291 Testicular hypofunction: Secondary | ICD-10-CM | POA: Diagnosis not present

## 2017-08-27 DIAGNOSIS — C44311 Basal cell carcinoma of skin of nose: Secondary | ICD-10-CM | POA: Diagnosis not present

## 2017-09-20 DIAGNOSIS — E291 Testicular hypofunction: Secondary | ICD-10-CM | POA: Diagnosis not present

## 2017-09-20 DIAGNOSIS — Z23 Encounter for immunization: Secondary | ICD-10-CM | POA: Diagnosis not present

## 2017-09-20 DIAGNOSIS — I10 Essential (primary) hypertension: Secondary | ICD-10-CM | POA: Diagnosis not present

## 2017-09-20 DIAGNOSIS — L03012 Cellulitis of left finger: Secondary | ICD-10-CM | POA: Diagnosis not present

## 2017-10-19 DIAGNOSIS — E291 Testicular hypofunction: Secondary | ICD-10-CM | POA: Diagnosis not present

## 2017-11-16 DIAGNOSIS — E291 Testicular hypofunction: Secondary | ICD-10-CM | POA: Diagnosis not present

## 2017-12-18 DIAGNOSIS — E291 Testicular hypofunction: Secondary | ICD-10-CM | POA: Diagnosis not present

## 2018-01-07 NOTE — Progress Notes (Signed)
This encounter was created in error - please disregard.

## 2018-01-14 DIAGNOSIS — E291 Testicular hypofunction: Secondary | ICD-10-CM | POA: Diagnosis not present

## 2018-01-22 DIAGNOSIS — F419 Anxiety disorder, unspecified: Secondary | ICD-10-CM | POA: Diagnosis not present

## 2018-01-22 DIAGNOSIS — R202 Paresthesia of skin: Secondary | ICD-10-CM | POA: Diagnosis not present

## 2018-01-22 DIAGNOSIS — R079 Chest pain, unspecified: Secondary | ICD-10-CM | POA: Diagnosis not present

## 2018-01-22 DIAGNOSIS — I10 Essential (primary) hypertension: Secondary | ICD-10-CM | POA: Diagnosis not present

## 2018-01-23 ENCOUNTER — Telehealth: Payer: Self-pay

## 2018-01-23 NOTE — Telephone Encounter (Signed)
SENT REFERRAL TO SCHEDULING FROM DR [place] [facility] # [phone]

## 2018-01-25 ENCOUNTER — Ambulatory Visit: Payer: BLUE CROSS/BLUE SHIELD | Admitting: Cardiology

## 2018-01-25 NOTE — Progress Notes (Deleted)
Cardiology Office Note:    Date:  01/25/2018   ID:  Richard Scott, DOB [birth year], MRN [id number]  PCP:  Ann Held, DO  Cardiologist:  No primary care provider on file.   Referring MD: Carollee Herter, Alferd Apa, *   No chief complaint on file. ***  History of Present Illness:    Richard Scott is a 62 y.o. male here for the evaluation of atypical chest pain at the request of Dr. Etter Sjogren.  Back in 2011 he had a stress echocardiogram performed that was normal.   No past medical history on file.  *** The histories are not reviewed yet. Please review them in the "History" navigator section and refresh this Hart.  Current Medications: No outpatient medications have been marked as taking for the 01/25/18 encounter (Appointment) with Jerline Pain, MD.     Allergies:   Codeine; Meperidine hcl; Morphine; Propoxyphene hcl; Sulfonamide derivatives; and Tetanus toxoid   Social History   Socioeconomic History  . Marital status: Married    Spouse name: Not on file  . Number of children: Not on file  . Years of education: Not on file  . Highest education level: Not on file  Social Needs  . Financial resource strain: Not on file  . Food insecurity - worry: Not on file  . Food insecurity - inability: Not on file  . Transportation needs - medical: Not on file  . Transportation needs - non-medical: Not on file  Occupational History  . Not on file  Tobacco Use  . Smoking status: Not on file  Substance and Sexual Activity  . Alcohol use: Not on file  . Drug use: Not on file  . Sexual activity: Not on file  Other Topics Concern  . Not on file  Social History Narrative  . Not on file     Family History: The patient's ***family history is not on file.  ROS:   Please see the history of present illness.    *** All other systems reviewed and are negative.  EKGs/Labs/Other Studies Reviewed:    The following studies were reviewed today: ***  EKG:  EKG is ***  ordered today.  The ekg ordered today demonstrates ***  Recent Labs: No results found for requested labs within last 8760 hours.  Recent Lipid Panel    Component Value Date/Time   CHOL 264 (H) 02/01/2009 0845   TRIG 122.0 02/01/2009 0845   HDL 46.80 02/01/2009 0845   CHOLHDL 6 02/01/2009 0845   VLDL 24.4 02/01/2009 0845   LDLCALC 116 (H) 07/27/2008 1001   LDLDIRECT 193.9 02/01/2009 0845    Physical Exam:    VS:  There were no vitals taken for this visit.    Wt Readings from Last 3 Encounters:  No data found for Wt     GEN: *** Well nourished, well developed in no acute distress HEENT: Normal NECK: No JVD; No carotid bruits LYMPHATICS: No lymphadenopathy CARDIAC: ***RRR, no murmurs, rubs, gallops RESPIRATORY:  Clear to auscultation without rales, wheezing or rhonchi  ABDOMEN: Soft, non-tender, non-distended MUSCULOSKELETAL:  No edema; No deformity  SKIN: Warm and dry NEUROLOGIC:  Alert and oriented x 3 PSYCHIATRIC:  Normal affect   ASSESSMENT:    No diagnosis found. PLAN:    In order of problems listed above:  1. ***   Medication Adjustments/Labs and Tests Ordered: Current medicines are reviewed at length with the patient today.  Concerns regarding medicines are outlined above.  No  orders of the defined types were placed in this encounter.  No orders of the defined types were placed in this encounter.   Signed, Candee Furbish, MD  01/25/2018 7:12 AM    [name]

## 2018-02-04 ENCOUNTER — Ambulatory Visit: Payer: BLUE CROSS/BLUE SHIELD | Admitting: Cardiology

## 2018-02-04 ENCOUNTER — Encounter: Payer: Self-pay | Admitting: Cardiology

## 2018-02-04 VITALS — BP 150/92 | HR 83 | Ht 72.0 in | Wt 243.1 lb

## 2018-02-04 DIAGNOSIS — I480 Paroxysmal atrial fibrillation: Secondary | ICD-10-CM

## 2018-02-04 DIAGNOSIS — R0789 Other chest pain: Secondary | ICD-10-CM

## 2018-02-04 DIAGNOSIS — I10 Essential (primary) hypertension: Secondary | ICD-10-CM

## 2018-02-04 DIAGNOSIS — E785 Hyperlipidemia, unspecified: Secondary | ICD-10-CM | POA: Insufficient documentation

## 2018-02-04 NOTE — Patient Instructions (Signed)
Medication Instructions:  Your physician recommends that you continue on your current medications as directed. Please refer to the Current Medication list given to you today.   Labwork: None  Testing/Procedures: You had an EKG today.  Your physician has requested that you have an echocardiogram. Echocardiography is a painless test that uses sound waves to create images of your heart. It provides your doctor with information about the size and shape of your heart and how well your heart's chambers and valves are working. This procedure takes approximately one hour. There are no restrictions for this procedure.  Your physician has requested that you have a stress echocardiogram. For further information please visit [email or web address]. Please follow instruction sheet as given.    Follow-Up: Your physician recommends that you schedule a follow-up appointment in: 1 month.  Any Other Special Instructions Will Be Listed Below (If Applicable).     If you need a refill on your cardiac medications before your next appointment, please call your pharmacy.

## 2018-02-04 NOTE — Progress Notes (Signed)
Cardiology Consultation:    Date:  02/04/2018   ID:  Richard Scott, DOB 1956/04/01, MRN [id number]  PCP:  Ann Held, DO  Cardiologist:  Jenne Campus, MD   Referring MD: Carollee Herter, Alferd Apa, *   Chief Complaint  Patient presents with  . Referral  . Chest Pain  . Dizziness  I have some chest pain  History of Present Illness:    Richard Scott is a 62 y.o. male who is being seen today for the evaluation of chest pain at the request of Ann Held, *.  7 years ago he ended up having sepsis.  He was very sick in the hospital he was fine to have elevated troponin as well as diminished ejection fraction 40%.  However after that he saw a cardiologist who repeat echocardiogram later that showed preserved left ventricular ejection fraction.  He is doing overall very well however a few weeks ago he was just about to fly to [place] he went to the plane try to put his luggage into the overhead compartment after that he started having cramps in the left shoulder.  On top of that he was very hot.  He became very worried and scared and requested some medical assistant.  EMS came EKG was done his blood pressure was very high and eventually his daughter took him home.  Shortly after that he saw his primary care physician.  He was recommended to see [place].  He described to have some atypical pain of left shoulder not related to exercise.  He does not exercise on the regular basis.  Described to have some exertional shortness of breath but no chest pain tightness squeezing pressure burning chest no palpitations.  Past Medical History:  Diagnosis Date  . ALLERGIC RHINITIS 03/05/2007   Qualifier: Diagnosis of  By: Jerold Coombe    . Anxiety   . ATRIAL FIBRILLATION, PAROXYSMAL 04/28/2010   Qualifier: Diagnosis of  By: Ron Parker, MD, Leonidas Romberg Dorinda Hill   . CALCULUS, KIDNEY 03/05/2007   Qualifier: Diagnosis of  By: Jerold Coombe    . CARDIOMYOPATHY 07/29/2010   Qualifier: Diagnosis  of  By: Rose Fillers, RN, Heather    . Chest pain   . CHEST PAIN UNSPECIFIED 10/01/2008   Qualifier: Diagnosis of  By: Jerold Coombe    . DEPRESSION 04/25/2007   Qualifier: Diagnosis of  By: Jerold Coombe    . ED (erectile dysfunction)   . HYPERLIPIDEMIA 03/05/2007   Qualifier: Diagnosis of  By: Jerold Coombe    . HYPERTENSION 10/01/2008   Qualifier: Diagnosis of  By: Jerold Coombe    . OTHER DRUG ALLERGY 01/18/2009   Qualifier: Diagnosis of  By: Jerold Coombe    . PALPITATIONS, HX OF 04/25/2007   Qualifier: Diagnosis of  By: Jerold Coombe    . Paresthesia of hand   . SINUSITIS, ACUTE NOS 04/25/2007   Qualifier: Diagnosis of  By: Jerold Coombe    . Tinnitus     History reviewed. No pertinent surgical history.  Current Medications: Current Meds  Medication Sig  . GLUCOSAMINE HCL PO Take by mouth.  . loratadine (CLARITIN) 10 MG tablet Take 10 mg by mouth daily.  . metoprolol succinate (TOPROL-XL) 100 MG 24 hr tablet Take 50 mg by mouth daily.   . montelukast (SINGULAIR) 10 MG tablet Take 10 mg by mouth at bedtime.  . Multiple Vitamin (MULTIVITAMIN) tablet Take 1 tablet by mouth  daily.  . simvastatin (ZOCOR) 40 MG tablet Take 1 tablet by mouth daily.  Marland Kitchen testosterone cypionate (DEPO-TESTOSTERONE) 200 MG/ML injection Inject 150 mg into the muscle every 14 (fourteen) days.  . zaleplon (SONATA) 10 MG capsule Take 10 mg by mouth at bedtime as needed for sleep.     Allergies:   Codeine; Darvon [propoxyphene]; Demerol [meperidine hcl]; Meperidine hcl; Morphine; Penicillins; Propoxyphene hcl; Sulfonamide derivatives; Tetanus toxoid; Tramadol; and Valsartan-hydrochlorothiazide   Social History   Socioeconomic History  . Marital status: Married    Spouse name: Not on file  . Number of children: Not on file  . Years of education: Not on file  . Highest education level: Not on file  Occupational History  . Not on file  Social Needs  . Financial resource strain: Not on file    . Food insecurity:    Worry: Not on file    Inability: Not on file  . Transportation needs:    Medical: Not on file    Non-medical: Not on file  Tobacco Use  . Smoking status: Never Smoker  . Smokeless tobacco: Never Used  Substance and Sexual Activity  . Alcohol use: Not on file  . Drug use: Not on file  . Sexual activity: Not on file  Lifestyle  . Physical activity:    Days per week: Not on file    Minutes per session: Not on file  . Stress: Not on file  Relationships  . Social connections:    Talks on phone: Not on file    Gets together: Not on file    Attends religious service: Not on file    Active member of club or organization: Not on file    Attends meetings of clubs or organizations: Not on file    Relationship status: Not on file  Other Topics Concern  . Not on file  Social History Narrative  . Not on file     Family History: The patient's family history is not on file. ROS:   Please see the history of present illness.    All 14 point review of systems negative except as described per history of present illness.  EKGs/Labs/Other Studies Reviewed:    The following studies were reviewed today:   EKG:  EKG is  ordered today.  The ekg ordered today demonstrates normal sinus rhythm, normal P interval, normal QRS complex duration morphology no ST-T segment changes.  Recent Labs: No results found for requested labs within last 8760 hours.  Recent Lipid Panel    Component Value Date/Time   CHOL 264 (H) 02/01/2009 0845   TRIG 122.0 02/01/2009 0845   HDL 46.80 02/01/2009 0845   CHOLHDL 6 02/01/2009 0845   VLDL 24.4 02/01/2009 0845   LDLCALC 116 (H) 07/27/2008 1001   LDLDIRECT 193.9 02/01/2009 0845    Physical Exam:    VS:  BP (!) 150/92 (BP Location: Left Arm, Patient Position: Sitting, Cuff Size: Normal)   Pulse 83   Ht 6' (1.829 m)   Wt 243 lb 1.9 oz (110.3 kg)   SpO2 98%   BMI 32.97 kg/m     Wt Readings from Last 3 Encounters:  02/04/18 243  lb 1.9 oz (110.3 kg)     GEN:  Well nourished, well developed in no acute distress HEENT: Normal NECK: No JVD; No carotid bruits LYMPHATICS: No lymphadenopathy CARDIAC: RRR, no murmurs, no rubs, no gallops RESPIRATORY:  Clear to auscultation without rales, wheezing or rhonchi  ABDOMEN: Soft, non-tender,  non-distended MUSCULOSKELETAL:  No edema; No deformity  SKIN: Warm and dry NEUROLOGIC:  Alert and oriented x 3 PSYCHIATRIC:  Normal affect   ASSESSMENT:    1. Atypical chest pain   2. Dyslipidemia   3. Paroxysmal atrial fibrillation (HCC)   4. Essential hypertension    PLAN:    In order of problems listed above:  1. Atypical chest pain: I would recommend stress test to clarify make sure there is no inducible ischemia.  Since he does have history of cardiomyopathy with diminished ejection fraction I will ask him to have repeated echocardiogram to check left ventricular ejection fraction.  I will not alter any of his medications right now. 2. Dyslipidemia: On simvastatin and next month he has yearly appointment with his primary care physician he will have fasting lipid profile done there.  Will adjust medications accordingly. 3. There is some documentation of proximal atrial fibrillation while he was in sepsis in the hospital since that time there is no atrial fibrillation. 4. Essential hypertension: I instructed him to check his blood pressure on the regular basis and then will have a discussion about what to do with the situation.   Medication Adjustments/Labs and Tests Ordered: Current medicines are reviewed at length with the patient today.  Concerns regarding medicines are outlined above.  No orders of the defined types were placed in this encounter.  No orders of the defined types were placed in this encounter.   Signed, Park Liter, MD, [facility]. 02/04/2018 10:05 AM    [place]

## 2018-02-07 ENCOUNTER — Ambulatory Visit: Payer: BLUE CROSS/BLUE SHIELD | Admitting: Interventional Cardiology

## 2018-02-11 ENCOUNTER — Ambulatory Visit (HOSPITAL_BASED_OUTPATIENT_CLINIC_OR_DEPARTMENT_OTHER)
Admission: RE | Admit: 2018-02-11 | Discharge: 2018-02-11 | Disposition: A | Discharge status: Home / Self Care | Payer: BLUE CROSS/BLUE SHIELD | Source: Ambulatory Visit | Attending: Cardiology | Admitting: Cardiology

## 2018-02-11 DIAGNOSIS — R0789 Other chest pain: Secondary | ICD-10-CM | POA: Diagnosis not present

## 2018-02-11 DIAGNOSIS — I429 Cardiomyopathy, unspecified: Secondary | ICD-10-CM | POA: Insufficient documentation

## 2018-02-11 DIAGNOSIS — E785 Hyperlipidemia, unspecified: Secondary | ICD-10-CM | POA: Diagnosis not present

## 2018-02-11 DIAGNOSIS — I10 Essential (primary) hypertension: Secondary | ICD-10-CM | POA: Diagnosis not present

## 2018-02-11 DIAGNOSIS — I480 Paroxysmal atrial fibrillation: Secondary | ICD-10-CM | POA: Diagnosis not present

## 2018-02-11 NOTE — Progress Notes (Signed)
Echocardiogram 2D Echocardiogram has been performed.  Richard Scott 02/11/2018, 2:03 PM

## 2018-02-12 DIAGNOSIS — I10 Essential (primary) hypertension: Secondary | ICD-10-CM | POA: Diagnosis not present

## 2018-02-12 DIAGNOSIS — G4700 Insomnia, unspecified: Secondary | ICD-10-CM | POA: Diagnosis not present

## 2018-02-12 DIAGNOSIS — E291 Testicular hypofunction: Secondary | ICD-10-CM | POA: Diagnosis not present

## 2018-02-12 DIAGNOSIS — Z125 Encounter for screening for malignant neoplasm of prostate: Secondary | ICD-10-CM | POA: Diagnosis not present

## 2018-02-12 DIAGNOSIS — E7800 Pure hypercholesterolemia, unspecified: Secondary | ICD-10-CM | POA: Diagnosis not present

## 2018-02-14 ENCOUNTER — Telehealth: Payer: Self-pay | Admitting: Cardiology

## 2018-02-14 NOTE — Telephone Encounter (Signed)
Regarding instructions for stress test scheduled

## 2018-02-14 NOTE — Telephone Encounter (Signed)
Patient wanted to clarify about medications that he should not take before stress echo. Advised patient he can take all medications except for his metoprolol. Patient verbalized understanding. No further questions.

## 2018-03-01 ENCOUNTER — Other Ambulatory Visit (HOSPITAL_BASED_OUTPATIENT_CLINIC_OR_DEPARTMENT_OTHER): Payer: BLUE CROSS/BLUE SHIELD

## 2018-03-06 ENCOUNTER — Ambulatory Visit (HOSPITAL_BASED_OUTPATIENT_CLINIC_OR_DEPARTMENT_OTHER)
Admission: RE | Admit: 2018-03-06 | Discharge: 2018-03-06 | Disposition: A | Discharge status: Home / Self Care | Payer: BLUE CROSS/BLUE SHIELD | Source: Ambulatory Visit | Attending: Cardiology | Admitting: Cardiology

## 2018-03-06 DIAGNOSIS — I480 Paroxysmal atrial fibrillation: Secondary | ICD-10-CM

## 2018-03-06 DIAGNOSIS — R0789 Other chest pain: Secondary | ICD-10-CM

## 2018-03-06 DIAGNOSIS — H8110 Benign paroxysmal vertigo, unspecified ear: Secondary | ICD-10-CM | POA: Diagnosis not present

## 2018-03-06 DIAGNOSIS — I10 Essential (primary) hypertension: Secondary | ICD-10-CM

## 2018-03-07 ENCOUNTER — Ambulatory Visit: Payer: BLUE CROSS/BLUE SHIELD | Admitting: Cardiology

## 2018-03-15 DIAGNOSIS — E291 Testicular hypofunction: Secondary | ICD-10-CM | POA: Diagnosis not present

## 2018-04-03 ENCOUNTER — Encounter (HOSPITAL_BASED_OUTPATIENT_CLINIC_OR_DEPARTMENT_OTHER): Payer: Self-pay

## 2018-04-03 ENCOUNTER — Ambulatory Visit (HOSPITAL_BASED_OUTPATIENT_CLINIC_OR_DEPARTMENT_OTHER): Admission: RE | Admit: 2018-04-03 | Payer: BLUE CROSS/BLUE SHIELD | Source: Ambulatory Visit

## 2018-04-03 ENCOUNTER — Other Ambulatory Visit: Payer: Self-pay

## 2018-04-03 DIAGNOSIS — R0789 Other chest pain: Secondary | ICD-10-CM

## 2018-04-03 NOTE — Progress Notes (Signed)
Patient has ongoing episodes of vertigo. Stress echo has been cancelled once before due to this. Per Dr. Agustin Cree the patient cannot have stress echo today either due to ongoing vertigo. Informed patient that the test will be changed to a lexi instead. Patient has been educated regarding this test.

## 2018-04-04 ENCOUNTER — Ambulatory Visit: Payer: BLUE CROSS/BLUE SHIELD | Admitting: Cardiology

## 2018-04-09 ENCOUNTER — Telehealth (HOSPITAL_COMMUNITY): Payer: Self-pay | Admitting: *Deleted

## 2018-04-09 NOTE — Telephone Encounter (Signed)
Patient given detailed instructions per Myocardial Perfusion Study Information Sheet for the test on 04/11/18 at 0745. Patient notified to arrive 15 minutes early and that it is imperative to arrive on time for appointment to keep from having the test rescheduled.  If you need to cancel or reschedule your appointment, please call the office within 24 hours of your appointment. . Patient verbalized understanding.Richard Scott, Ranae Palms

## 2018-04-11 ENCOUNTER — Encounter (INDEPENDENT_AMBULATORY_CARE_PROVIDER_SITE_OTHER): Payer: Self-pay

## 2018-04-11 ENCOUNTER — Ambulatory Visit (HOSPITAL_COMMUNITY): Payer: BLUE CROSS/BLUE SHIELD | Attending: Internal Medicine

## 2018-04-11 VITALS — Ht 72.0 in | Wt 243.0 lb

## 2018-04-11 DIAGNOSIS — R0789 Other chest pain: Secondary | ICD-10-CM

## 2018-04-11 LAB — MYOCARDIAL PERFUSION IMAGING
CHL CUP NUCLEAR SRS: 2
CHL CUP RESTING HR STRESS: 64 {beats}/min
CSEPPHR: 80 {beats}/min
LVDIAVOL: 96 mL (ref 62–150)
LVSYSVOL: 42 mL
NUC STRESS TID: 0.99
RATE: 0.33
SDS: 2
SSS: 4

## 2018-04-11 MED ORDER — TECHNETIUM TC 99M TETROFOSMIN IV KIT
32.7000 | PACK | Freq: Once | INTRAVENOUS | Status: AC | PRN
  Administered 2018-04-11: 32.7 via INTRAVENOUS
  Filled 2018-04-11: qty 33

## 2018-04-11 MED ORDER — REGADENOSON 0.4 MG/5ML IV SOLN
0.4000 mg | Freq: Once | INTRAVENOUS | Status: AC
  Administered 2018-04-11: 0.4 mg via INTRAVENOUS

## 2018-04-11 MED ORDER — TECHNETIUM TC 99M TETROFOSMIN IV KIT
10.7000 | PACK | Freq: Once | INTRAVENOUS | Status: AC | PRN
  Administered 2018-04-11: 10.7 via INTRAVENOUS
  Filled 2018-04-11: qty 11

## 2018-04-15 ENCOUNTER — Encounter: Payer: Self-pay | Admitting: *Deleted

## 2018-04-15 ENCOUNTER — Encounter: Payer: Self-pay | Admitting: Cardiology

## 2018-04-15 ENCOUNTER — Ambulatory Visit: Payer: BLUE CROSS/BLUE SHIELD | Admitting: Cardiology

## 2018-04-15 VITALS — BP 138/74 | HR 76 | Ht 72.0 in | Wt 236.0 lb

## 2018-04-15 DIAGNOSIS — I480 Paroxysmal atrial fibrillation: Secondary | ICD-10-CM | POA: Diagnosis not present

## 2018-04-15 DIAGNOSIS — R0789 Other chest pain: Secondary | ICD-10-CM

## 2018-04-15 DIAGNOSIS — E785 Hyperlipidemia, unspecified: Secondary | ICD-10-CM | POA: Diagnosis not present

## 2018-04-15 NOTE — Progress Notes (Signed)
Cardiology Office Note:    Date:  04/15/2018   ID:  Richard Scott, DOB 04/17/1956, MRN [id number]  PCP:  Shirline Frees, MD  Cardiologist:  Jenne Campus, MD    Referring MD: Carollee Herter, Alferd Apa, *   Chief Complaint  Patient presents with  . 1 month follow up  Doing well  History of Present Illness:    Richard Scott is a 62 y.o. male who was referred to [place] because of episode of chest pain that he had when he was trying to go to [place].  He went to the plane he was trying to get his luggage from overhead compartment starting some chest tightness and up going to EMS.  Troponins were checked all were normal.  After that he came to [place] stress test was done was negative echocardiogram showed preserved left ventricular ejection fraction.  Few years ago he had sepsis ended up having cardiomyopathy with ejection fraction 40% which was probably related to sepsis.  At that time he had episode of atrial fibrillation.  Since that time no palpitations.  Past Medical History:  Diagnosis Date  . ALLERGIC RHINITIS 03/05/2007   Qualifier: Diagnosis of  By: Jerold Coombe    . Anxiety   . ATRIAL FIBRILLATION, PAROXYSMAL 04/28/2010   Qualifier: Diagnosis of  By: Ron Parker, MD, Leonidas Romberg Dorinda Hill   . CALCULUS, KIDNEY 03/05/2007   Qualifier: Diagnosis of  By: Jerold Coombe    . CARDIOMYOPATHY 07/29/2010   Qualifier: Diagnosis of  By: Rose Fillers, RN, Heather    . Chest pain   . CHEST PAIN UNSPECIFIED 10/01/2008   Qualifier: Diagnosis of  By: Jerold Coombe    . DEPRESSION 04/25/2007   Qualifier: Diagnosis of  By: Jerold Coombe    . ED (erectile dysfunction)   . HYPERLIPIDEMIA 03/05/2007   Qualifier: Diagnosis of  By: Jerold Coombe    . HYPERTENSION 10/01/2008   Qualifier: Diagnosis of  By: Jerold Coombe    . OTHER DRUG ALLERGY 01/18/2009   Qualifier: Diagnosis of  By: Jerold Coombe    . PALPITATIONS, HX OF 04/25/2007   Qualifier: Diagnosis of  By: Jerold Coombe    . Paresthesia of  hand   . SINUSITIS, ACUTE NOS 04/25/2007   Qualifier: Diagnosis of  By: Jerold Coombe    . Tinnitus     No past surgical history on file.  Current Medications: Current Meds  Medication Sig  . GLUCOSAMINE HCL PO Take by mouth.  . loratadine (CLARITIN) 10 MG tablet Take 10 mg by mouth daily.  . metoprolol succinate (TOPROL-XL) 100 MG 24 hr tablet Take 150 mg by mouth daily.   . montelukast (SINGULAIR) 10 MG tablet Take 10 mg by mouth at bedtime.  . Multiple Vitamin (MULTIVITAMIN) tablet Take 1 tablet by mouth daily.  . simvastatin (ZOCOR) 40 MG tablet Take 1 tablet by mouth daily.  Marland [employer] testosterone cypionate (DEPO-TESTOSTERONE) 200 MG/ML injection Inject 150 mg into the muscle every 14 (fourteen) days.     Allergies:   Codeine; Darvon [propoxyphene]; Demerol [meperidine hcl]; Meperidine hcl; Morphine; Penicillins; Propoxyphene hcl; Sulfonamide derivatives; Tetanus toxoid; Tramadol; and Valsartan-hydrochlorothiazide   Social History   Socioeconomic History  . Marital status: Married    Spouse name: Not on file  . Number of children: Not on file  . Years of education: Not on file  . Highest education level: Not on file  Occupational History  . Not on file  Social Needs  . Financial resource strain: Not on file  . Food insecurity:    Worry: Not on file    Inability: Not on file  . Transportation needs:    Medical: Not on file    Non-medical: Not on file  Tobacco Use  . Smoking status: Never Smoker  . Smokeless tobacco: Never Used  Substance and Sexual Activity  . Alcohol use: Not on file  . Drug use: Not on file  . Sexual activity: Not on file  Lifestyle  . Physical activity:    Days per week: Not on file    Minutes per session: Not on file  . Stress: Not on file  Relationships  . Social connections:    Talks on phone: Not on file    Gets together: Not on file    Attends religious service: Not on file    Active member of club or organization: Not on file     Attends meetings of clubs or organizations: Not on file    Relationship status: Not on file  Other Topics Concern  . Not on file  Social History Narrative  . Not on file     Family History: The patient's family history is not on file. ROS:   Please see the history of present illness.    All 14 point review of systems negative except as described per history of present illness  EKGs/Labs/Other Studies Reviewed:      Recent Labs: No results found for requested labs within last 8760 hours.  Recent Lipid Panel    Component Value Date/Time   CHOL 264 (H) 02/01/2009 0845   TRIG 122.0 02/01/2009 0845   HDL 46.80 02/01/2009 0845   CHOLHDL 6 02/01/2009 0845   VLDL 24.4 02/01/2009 0845   LDLCALC 116 (H) 07/27/2008 1001   LDLDIRECT 193.9 02/01/2009 0845    Physical Exam:    VS:  BP 138/74   Pulse 76   Ht 6' (1.829 m)   Wt 236 lb (107 kg)   SpO2 96%   BMI 32.01 kg/m     Wt Readings from Last 3 Encounters:  04/15/18 236 lb (107 kg)  04/11/18 243 lb (110.2 kg)  02/04/18 243 lb 1.9 oz (110.3 kg)     GEN:  Well nourished, well developed in no acute distress HEENT: Normal NECK: No JVD; No carotid bruits LYMPHATICS: No lymphadenopathy CARDIAC: RRR, no murmurs, no rubs, no gallops RESPIRATORY:  Clear to auscultation without rales, wheezing or rhonchi  ABDOMEN: Soft, non-tender, non-distended MUSCULOSKELETAL:  No edema; No deformity  SKIN: Warm and dry LOWER EXTREMITIES: no swelling NEUROLOGIC:  Alert and oriented x 3 PSYCHIATRIC:  Normal affect   ASSESSMENT:    1. Atypical chest pain   2. Dyslipidemia   3. Paroxysmal atrial fibrillation (HCC)    PLAN:    In order of problems listed above:  1. Atypical chest pain: Denies having any since the time I seen him last time he flew to [place] and had no problem.  Stress test was negative.  We will continue conservative approach. 2. Dyslipidemia: He takes a low intensity statin.  Will call primary care physician to get  fasting lipid profile 3. Paroxysmal atrial fibrillation only one documented episode while he was having sepsis most likely related to it.  Overall he is doing well we talked about risk factors modifications we talked about diet exercises I recommended him to get a fit bit.  I see him back in my office in  about 3 months or sooner if he had a problem   Medication Adjustments/Labs and Tests Ordered: Current medicines are reviewed at length with the patient today.  Concerns regarding medicines are outlined above.  No orders of the defined types were placed in this encounter.  Medication changes: No orders of the defined types were placed in this encounter.   Signed, Park Liter, MD, [facility] 04/15/2018 10:18 AM    [place]

## 2018-04-15 NOTE — Patient Instructions (Signed)
Medication Instructions:  Your physician recommends that you continue on your current medications as directed. Please refer to the Current Medication list given to you today.   Labwork: None  Testing/Procedures: None  Follow-Up: Your physician wants you to follow-up in: 3 months. You will receive a reminder letter in the mail two months in advance. If you don't receive a letter, please call our office to schedule the follow-up appointment.   If you need a refill on your cardiac medications before your next appointment, please call your pharmacy.   Thank you for choosing [facility]! Azaiah Mello, RN [phone]    

## 2018-04-16 ENCOUNTER — Telehealth: Payer: Self-pay | Admitting: *Deleted

## 2018-04-16 NOTE — Telephone Encounter (Signed)
Left message to return call regarding stress test results.

## 2018-04-17 DIAGNOSIS — E291 Testicular hypofunction: Secondary | ICD-10-CM | POA: Diagnosis not present

## 2018-04-17 NOTE — Telephone Encounter (Signed)
Patient returned phone call and has been notified of results

## 2018-05-06 DIAGNOSIS — E291 Testicular hypofunction: Secondary | ICD-10-CM | POA: Diagnosis not present

## 2018-06-03 DIAGNOSIS — E291 Testicular hypofunction: Secondary | ICD-10-CM | POA: Diagnosis not present

## 2018-06-24 DIAGNOSIS — E291 Testicular hypofunction: Secondary | ICD-10-CM | POA: Diagnosis not present

## 2018-07-16 DIAGNOSIS — E291 Testicular hypofunction: Secondary | ICD-10-CM | POA: Diagnosis not present

## 2018-08-05 DIAGNOSIS — E291 Testicular hypofunction: Secondary | ICD-10-CM | POA: Diagnosis not present

## 2018-08-16 DIAGNOSIS — E7800 Pure hypercholesterolemia, unspecified: Secondary | ICD-10-CM | POA: Diagnosis not present

## 2018-08-16 DIAGNOSIS — Z23 Encounter for immunization: Secondary | ICD-10-CM | POA: Diagnosis not present

## 2018-08-16 DIAGNOSIS — I10 Essential (primary) hypertension: Secondary | ICD-10-CM | POA: Diagnosis not present

## 2018-08-16 DIAGNOSIS — E291 Testicular hypofunction: Secondary | ICD-10-CM | POA: Diagnosis not present

## 2018-08-26 DIAGNOSIS — E291 Testicular hypofunction: Secondary | ICD-10-CM | POA: Diagnosis not present

## 2018-09-16 DIAGNOSIS — E291 Testicular hypofunction: Secondary | ICD-10-CM | POA: Diagnosis not present

## 2018-10-07 DIAGNOSIS — E291 Testicular hypofunction: Secondary | ICD-10-CM | POA: Diagnosis not present

## 2018-10-28 DIAGNOSIS — E291 Testicular hypofunction: Secondary | ICD-10-CM | POA: Diagnosis not present

## 2018-11-19 DIAGNOSIS — E291 Testicular hypofunction: Secondary | ICD-10-CM | POA: Diagnosis not present

## 2018-12-10 DIAGNOSIS — E291 Testicular hypofunction: Secondary | ICD-10-CM | POA: Diagnosis not present

## 2018-12-20 DIAGNOSIS — I10 Essential (primary) hypertension: Secondary | ICD-10-CM | POA: Diagnosis not present

## 2018-12-20 DIAGNOSIS — Z1211 Encounter for screening for malignant neoplasm of colon: Secondary | ICD-10-CM | POA: Diagnosis not present

## 2018-12-20 DIAGNOSIS — N5203 Combined arterial insufficiency and corporo-venous occlusive erectile dysfunction: Secondary | ICD-10-CM | POA: Diagnosis not present

## 2018-12-20 DIAGNOSIS — E291 Testicular hypofunction: Secondary | ICD-10-CM | POA: Diagnosis not present

## 2018-12-20 DIAGNOSIS — E7800 Pure hypercholesterolemia, unspecified: Secondary | ICD-10-CM | POA: Diagnosis not present

## 2018-12-20 DIAGNOSIS — Z125 Encounter for screening for malignant neoplasm of prostate: Secondary | ICD-10-CM | POA: Diagnosis not present

## 2018-12-30 DIAGNOSIS — E291 Testicular hypofunction: Secondary | ICD-10-CM | POA: Diagnosis not present

## 2019-01-13 DIAGNOSIS — E291 Testicular hypofunction: Secondary | ICD-10-CM | POA: Diagnosis not present

## 2019-02-03 DIAGNOSIS — E291 Testicular hypofunction: Secondary | ICD-10-CM | POA: Diagnosis not present

## 2019-02-27 DIAGNOSIS — J301 Allergic rhinitis due to pollen: Secondary | ICD-10-CM | POA: Diagnosis not present

## 2019-02-27 DIAGNOSIS — E7800 Pure hypercholesterolemia, unspecified: Secondary | ICD-10-CM | POA: Diagnosis not present

## 2019-02-27 DIAGNOSIS — I10 Essential (primary) hypertension: Secondary | ICD-10-CM | POA: Diagnosis not present

## 2019-02-27 DIAGNOSIS — E291 Testicular hypofunction: Secondary | ICD-10-CM | POA: Diagnosis not present

## 2019-03-14 DIAGNOSIS — R05 Cough: Secondary | ICD-10-CM | POA: Diagnosis not present

## 2019-03-14 DIAGNOSIS — K219 Gastro-esophageal reflux disease without esophagitis: Secondary | ICD-10-CM | POA: Diagnosis not present

## 2019-03-14 DIAGNOSIS — J301 Allergic rhinitis due to pollen: Secondary | ICD-10-CM | POA: Diagnosis not present

## 2019-05-01 DIAGNOSIS — E291 Testicular hypofunction: Secondary | ICD-10-CM | POA: Diagnosis not present

## 2019-05-22 DIAGNOSIS — E291 Testicular hypofunction: Secondary | ICD-10-CM | POA: Diagnosis not present

## 2019-06-10 DIAGNOSIS — H02831 Dermatochalasis of right upper eyelid: Secondary | ICD-10-CM | POA: Diagnosis not present

## 2019-06-10 DIAGNOSIS — G43B0 Ophthalmoplegic migraine, not intractable: Secondary | ICD-10-CM | POA: Diagnosis not present

## 2019-06-10 DIAGNOSIS — H02834 Dermatochalasis of left upper eyelid: Secondary | ICD-10-CM | POA: Diagnosis not present

## 2019-06-10 DIAGNOSIS — H2513 Age-related nuclear cataract, bilateral: Secondary | ICD-10-CM | POA: Diagnosis not present

## 2019-06-12 DIAGNOSIS — E291 Testicular hypofunction: Secondary | ICD-10-CM | POA: Diagnosis not present

## 2019-07-03 DIAGNOSIS — M545 Low back pain: Secondary | ICD-10-CM | POA: Diagnosis not present

## 2019-07-03 DIAGNOSIS — E291 Testicular hypofunction: Secondary | ICD-10-CM | POA: Diagnosis not present

## 2019-07-03 DIAGNOSIS — I10 Essential (primary) hypertension: Secondary | ICD-10-CM | POA: Diagnosis not present

## 2019-07-03 DIAGNOSIS — E7800 Pure hypercholesterolemia, unspecified: Secondary | ICD-10-CM | POA: Diagnosis not present

## 2019-07-03 DIAGNOSIS — D691 Qualitative platelet defects: Secondary | ICD-10-CM | POA: Diagnosis not present

## 2019-07-15 DIAGNOSIS — L821 Other seborrheic keratosis: Secondary | ICD-10-CM | POA: Diagnosis not present

## 2019-07-15 DIAGNOSIS — L738 Other specified follicular disorders: Secondary | ICD-10-CM | POA: Diagnosis not present

## 2019-07-15 DIAGNOSIS — L400 Psoriasis vulgaris: Secondary | ICD-10-CM | POA: Diagnosis not present

## 2019-07-15 DIAGNOSIS — L718 Other rosacea: Secondary | ICD-10-CM | POA: Diagnosis not present

## 2019-07-24 DIAGNOSIS — E291 Testicular hypofunction: Secondary | ICD-10-CM | POA: Diagnosis not present

## 2019-08-14 DIAGNOSIS — E291 Testicular hypofunction: Secondary | ICD-10-CM | POA: Diagnosis not present

## 2019-09-04 DIAGNOSIS — E291 Testicular hypofunction: Secondary | ICD-10-CM | POA: Diagnosis not present

## 2019-09-25 DIAGNOSIS — E291 Testicular hypofunction: Secondary | ICD-10-CM | POA: Diagnosis not present

## 2019-10-16 DIAGNOSIS — E291 Testicular hypofunction: Secondary | ICD-10-CM | POA: Diagnosis not present

## 2019-11-11 DIAGNOSIS — E291 Testicular hypofunction: Secondary | ICD-10-CM | POA: Diagnosis not present

## 2019-12-02 DIAGNOSIS — E291 Testicular hypofunction: Secondary | ICD-10-CM | POA: Diagnosis not present

## 2019-12-25 DIAGNOSIS — G4700 Insomnia, unspecified: Secondary | ICD-10-CM | POA: Diagnosis not present

## 2019-12-25 DIAGNOSIS — E7800 Pure hypercholesterolemia, unspecified: Secondary | ICD-10-CM | POA: Diagnosis not present

## 2019-12-25 DIAGNOSIS — E291 Testicular hypofunction: Secondary | ICD-10-CM | POA: Diagnosis not present

## 2019-12-25 DIAGNOSIS — Z125 Encounter for screening for malignant neoplasm of prostate: Secondary | ICD-10-CM | POA: Diagnosis not present

## 2019-12-25 DIAGNOSIS — I10 Essential (primary) hypertension: Secondary | ICD-10-CM | POA: Diagnosis not present

## 2020-01-15 DIAGNOSIS — E291 Testicular hypofunction: Secondary | ICD-10-CM | POA: Diagnosis not present

## 2020-02-05 DIAGNOSIS — E291 Testicular hypofunction: Secondary | ICD-10-CM | POA: Diagnosis not present

## 2020-02-25 DIAGNOSIS — E291 Testicular hypofunction: Secondary | ICD-10-CM | POA: Diagnosis not present

## 2020-03-15 DIAGNOSIS — E291 Testicular hypofunction: Secondary | ICD-10-CM | POA: Diagnosis not present

## 2020-04-06 DIAGNOSIS — E291 Testicular hypofunction: Secondary | ICD-10-CM | POA: Diagnosis not present

## 2020-04-27 DIAGNOSIS — E291 Testicular hypofunction: Secondary | ICD-10-CM | POA: Diagnosis not present

## 2020-05-18 DIAGNOSIS — E291 Testicular hypofunction: Secondary | ICD-10-CM | POA: Diagnosis not present

## 2020-06-08 DIAGNOSIS — E291 Testicular hypofunction: Secondary | ICD-10-CM | POA: Diagnosis not present

## 2021-05-30 ENCOUNTER — Other Ambulatory Visit (HOSPITAL_COMMUNITY): Payer: Self-pay | Admitting: Urology

## 2021-05-30 DIAGNOSIS — C61 Malignant neoplasm of prostate: Secondary | ICD-10-CM

## 2021-06-08 ENCOUNTER — Other Ambulatory Visit (HOSPITAL_COMMUNITY): Payer: BLUE CROSS/BLUE SHIELD

## 2021-06-08 ENCOUNTER — Encounter (HOSPITAL_COMMUNITY): Payer: BLUE CROSS/BLUE SHIELD

## 2021-06-10 ENCOUNTER — Other Ambulatory Visit: Payer: Self-pay

## 2021-06-10 ENCOUNTER — Ambulatory Visit (HOSPITAL_COMMUNITY)
Admission: RE | Admit: 2021-06-10 | Discharge: 2021-06-10 | Disposition: A | Discharge status: Home / Self Care | Payer: Managed Care, Other (non HMO) | Source: Ambulatory Visit | Attending: Urology | Admitting: Urology

## 2021-06-10 ENCOUNTER — Encounter (HOSPITAL_COMMUNITY)
Admission: RE | Admit: 2021-06-10 | Discharge: 2021-06-10 | Disposition: A | Discharge status: Home / Self Care | Payer: Managed Care, Other (non HMO) | Source: Ambulatory Visit | Attending: Urology | Admitting: Urology

## 2021-06-10 DIAGNOSIS — C61 Malignant neoplasm of prostate: Secondary | ICD-10-CM | POA: Diagnosis not present

## 2021-06-10 MED ORDER — TECHNETIUM TC 99M MEDRONATE IV KIT
19.4000 | PACK | Freq: Once | INTRAVENOUS | Status: AC
  Administered 2021-06-10: 19.4 via INTRAVENOUS

## 2021-06-27 ENCOUNTER — Encounter: Payer: Self-pay | Admitting: Medical Oncology

## 2021-06-30 ENCOUNTER — Encounter: Payer: Self-pay | Admitting: Medical Oncology

## 2021-06-30 DIAGNOSIS — C61 Malignant neoplasm of prostate: Secondary | ICD-10-CM | POA: Insufficient documentation

## 2021-07-04 ENCOUNTER — Encounter: Payer: Self-pay | Admitting: Medical Oncology

## 2021-07-04 NOTE — Progress Notes (Signed)
Spoke with patient to confirm appointment for [facility] 8/23, arriving @ 12:30 pm.

## 2021-07-04 NOTE — Progress Notes (Signed)
I called pt to introduce myself as the Prostate Nurse Navigator and the Coordinator of the Prostate [place].  1. I confirmed with the patient he is aware of his referral to the clinic 8/23, arriving @ 12:30 pm.   2. I discussed the format of the clinic and the physicians he will be seeing that day.  3. I discussed where the clinic is located and how to contact me.  4. I confirmed his address and informed him I would be mailing a packet of information and forms to be completed. I asked him to bring them with him the day of his appointment.   He voiced understanding of the above. I asked him to call me if he has any questions or concerns regarding his appointments or the forms he needs to complete.

## 2021-07-05 ENCOUNTER — Encounter: Payer: Self-pay | Admitting: Medical Oncology

## 2021-07-05 ENCOUNTER — Ambulatory Visit (HOSPITAL_BASED_OUTPATIENT_CLINIC_OR_DEPARTMENT_OTHER): Payer: Managed Care, Other (non HMO) | Admitting: Oncology

## 2021-07-05 ENCOUNTER — Ambulatory Visit
Admission: RE | Admit: 2021-07-05 | Discharge: 2021-07-05 | Disposition: A | Discharge status: Home / Self Care | Payer: Managed Care, Other (non HMO) | Source: Ambulatory Visit | Attending: Radiation Oncology | Admitting: Radiation Oncology

## 2021-07-05 ENCOUNTER — Encounter: Payer: Self-pay | Admitting: Radiation Oncology

## 2021-07-05 ENCOUNTER — Other Ambulatory Visit: Payer: Self-pay

## 2021-07-05 VITALS — BP 124/77 | HR 77 | Temp 97.3°F | Resp 20 | Wt 221.2 lb

## 2021-07-05 DIAGNOSIS — F329 Major depressive disorder, single episode, unspecified: Secondary | ICD-10-CM | POA: Diagnosis not present

## 2021-07-05 DIAGNOSIS — C61 Malignant neoplasm of prostate: Secondary | ICD-10-CM

## 2021-07-05 DIAGNOSIS — E785 Hyperlipidemia, unspecified: Secondary | ICD-10-CM | POA: Diagnosis not present

## 2021-07-05 DIAGNOSIS — I429 Cardiomyopathy, unspecified: Secondary | ICD-10-CM | POA: Insufficient documentation

## 2021-07-05 DIAGNOSIS — Z79899 Other long term (current) drug therapy: Secondary | ICD-10-CM | POA: Insufficient documentation

## 2021-07-05 DIAGNOSIS — I10 Essential (primary) hypertension: Secondary | ICD-10-CM | POA: Diagnosis not present

## 2021-07-05 DIAGNOSIS — I480 Paroxysmal atrial fibrillation: Secondary | ICD-10-CM | POA: Diagnosis not present

## 2021-07-05 NOTE — Consult Note (Signed)
[facility]     07/05/2021   --------------------------------------------------------------------------------   Richard Scott  MRN: [id number]  DOB: 02-23-1956, 65 year old Male  SSN:    PRIMARY CARE:  Richard Brine, MD  REFERRING:  Richard Seal, MD  PROVIDER:  Irine Scott, M.D.  TREATING:  Richard Scott, M.D.  LOCATION:  [facility] [phone]     --------------------------------------------------------------------------------   CC/HPI: CC: Prostate Cancer   Physician requesting consult: Dr. Irine Scott  PCP: Dr. Azalia Scott  Location of consult: [facility] - [facility]idisciplinary [facility]   Mr. Richard Scott is a 65 year old gentleman with a past medical history of asthma, hyperlipidemia, hypertension, and urolithiasis. He also has been on testosterone replacement therapy for hypogonadism per his PCP and was noted to have a rising PSA with his most recent PSA being 6.16. He underwent a TRUS biopsy by Dr. Jeffie Scott on 05/23/21 that confirmed Gleason 4+3=7 adenocarcinoma with 8 out of 12 biopsy cores positive for malignancy. Staging studies including a bone scan and CT abdomen and pelvis were negative for metastatic disease.   He is currently off testosterone replacement therapy but has found this to make an immense difference in his quality of life with regard to his energy level, libido, and erectile dysfunction. He has been on IM injections prior to stopping at the time of his prostate cancer diagnosis.   Family history: None.   Imaging studies:  CT abd/pelvis (06/10/21): Negative for metastatic disease  Bone scan (06/10/21): Focal uptake at left lower lumbar spine. Low suspicion for metastatic disease.   PMH: He has a history of asthma, hyperlipidemia, hypertension, urolithiasis, and low testosterone. He also has idiopathic thrombocytopenic purpura. This apparently developed after an episode of sepsis many years ago. He states that  his most recent platelet counts have been around 150,000. He has allergies to codeine, morphine, and penicillin.  PSH: No abdominal surgeries.   TNM stage: cT1c N0 M0  PSA: 6.16  Gleason score: 4+3=7 (GG 3)  Biopsy (05/23/21): 8/12 cores positive  Left: L lateral apex (10%, 3+3=6), L apex (3%, 3+4=7), L mid (1%, 3+4=7), L base (4%, 3+4=7)  Right: R apex (11%, 3+4=7), R mid (29%, 4+3=7, PNI), R base (62%, 4+3=7), R lateral base (1%, 3+4=7)  Prostate volume: 40.0 cc   Nomogram  OC disease: 36%  EPE: 61%  SVI: 16%  LNI: 15%  PFS (5 year, 10 year): 61%, 45%   Urinary function: IPSS is 6.  Erectile function: SHIM score is 15. He does use tadalafil 20 mg prn on demand with reasonable results. He has previously taken sildenafil which was more effective became with a terrible headache which precluded him from continuing to take this.     ALLERGIES: Codine morphine penicillin    MEDICATIONS: Simvastatin 40 mg tablet  Amlodipine Besylate 5 mg tablet  Irbesartan 300 mg tablet  Montelukast Sodium  Testosterone     GU PSH: Cystoscopy Ureteroscopy Locm 300-'399Mg'$ /Ml Iodine,1Ml - 06/10/2021 Prostate Needle Biopsy - 05/23/2021       PSH Notes: left thumb partial amputation, cystoscopic 1974-76   NON-GU PSH: Surgical Pathology, Gross And Microscopic Examination For Prostate Needle - 05/23/2021     GU PMH: Prostate Cancer, He has T1c N0 M0 GG3 unfavorable intermediate risk prostate cancer with a 25m prostate with mild LUTS and moderate to severe ED that is worse off of TRT. I reviewed the options for therapy and associated risks. I don't believe he  is an appropriate candidate for Surveillance and I don't think he is appropriate for Cryo or HIFU. I discussed RALP in detail. I discussed EXRT with gold seeds and SpaceOAR in detail along with the possible need for 66-66moof ADT. I discussed seeds in detail as well but feel he is not likely to be a candidate for seed monotherapy with his risk of  LNI. Since he has been on TRT but is now off, I will repeat a PSA and get a testosterone level prior to his f/u with the Richard Scott 2 weeks. [facility] Elevated PSA - 06/10/2021, - 05/23/2021, His PSA double over the past year. I am going to repeat the level with a week of abstinence and if it remains elevated I will have him return for a biopsy. I reviewed the risks of bleeding, infection and voiding difficulty. If it is falling, I will get a repeat in 3-4 months. , - 02/23/2021 BPH w/LUTS, He has a benign exam and mild LUTs. I don't see a cause for the perineal warmness. - 02/23/2021 Nocturia - 02/23/2021      PMH Notes: kidney stone   NON-GU PMH: Hypogonadotropic hypogonadism, He is symptomatic with low libido and fatigue off of the testosterone replacement. I suggested he consider increasing his caffeine intake to help with the energy until we can sort out the prostate cancer management but hopefully he will be able to resume therapy after completion of prostate cancer treatment. - 06/22/2021 Asthma Hypercholesterolemia Hypertension Immune thrombocytopenic purpura    FAMILY HISTORY: No Family History    SOCIAL HISTORY: Marital Status: Married Preferred Language: English; Ethnicity: Not Hispanic Or Latino; Race: White Current Smoking Status: Patient has never smoked.   Tobacco Use Assessment Completed: Used Tobacco in last 30 days? Does not use smokeless tobacco. Does not use drugs. Drinks 4+ caffeinated drinks per day. 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 Left lobe normal consistency, right lobe normal consistency. Symmetrical lobes. No prostate nodule. Left lobe no tenderness, right lobe no tenderness.    MULTI-SYSTEM PHYSICAL EXAMINATION:    Constitutional: Well-nourished. No physical deformities. Normally developed. Good grooming.   Neck: Neck symmetrical, not swollen. Normal tracheal position.  Respiratory: No labored breathing, no use of accessory muscles. Clear bilaterally.  Cardiovascular: Normal temperature, normal extremity pulses, no swelling, no varicosities. Regular rate and rhythm.  Lymphatic: No enlargement of neck, axillae, groin.  Skin: No paleness, no jaundice, no cyanosis. No lesion, no ulcer, no rash.  Neurologic / Psychiatric: Oriented to time, oriented to place, oriented to person. No depression, no anxiety, no agitation.  Gastrointestinal: No mass, no tenderness, no rigidity, non obese abdomen.  Eyes: Normal conjunctivae. Normal eyelids.  Ears, Nose, Mouth, and Throat: Left ear no scars, no lesions, no masses. Right ear no scars, no lesions, no masses. Nose no scars, no lesions, no masses. Normal hearing. Normal lips.  Musculoskeletal: Normal gait and station of head and neck.     Complexity of Data:  Lab Test Review:   PSA  Records Review:   Pathology Reports, Previous Patient Records  X-Ray Review: C.T. Abdomen/Pelvis: Reviewed Films.  Bone Scan: Reviewed Films.     06/22/21 03/22/21  PSA  Total PSA 6.16 ng/mL 8.29 ng/mL  Free PSA 0.85 ng/mL 1.41 ng/mL  % Free PSA 14 % PSA 17 % PSA  PROCEDURES: None   ASSESSMENT:      ICD-10 Details  1 GU:   Prostate Cancer - C61    PLAN:           Document Letter(s):  Created for Patient: Clinical Summary         Notes:   1. Unfavorable intermediate risk prostate cancer: I had a detailed discussion with Mr. Schwalbach and his wife today regarding his prostate cancer situation and options for management/treatment. The patient was counseled about the natural history of prostate cancer and the standard treatment options that are available for prostate cancer. It was explained to him how his age and life expectancy, clinical stage, Gleason score, and PSA affect his prognosis, the decision to proceed with additional staging studies, as well as how that  information influences recommended treatment strategies. We discussed the roles for active surveillance, radiation therapy, surgical therapy, androgen deprivation, as well as ablative therapy options for the treatment of prostate cancer as appropriate to his individual cancer situation. We discussed the risks and benefits of these options with regard to their impact on cancer control and also in terms of potential adverse events, complications, and impact on quality of life particularly related to urinary and sexual function. The patient was encouraged to ask questions throughout the discussion today and all questions were answered to his stated satisfaction. In addition, the patient was provided with and/or directed to appropriate resources and literature for further education about prostate cancer and treatment options. We discussed surgical therapy for prostate cancer including the different available surgical approaches. We discussed, in detail, the risks and expectations of surgery with regard to cancer control, urinary control, and erectile function as well as the expected postoperative recovery process. Additional risks of surgery including but not limited to bleeding, infection, hernia formation, nerve damage, lymphocele formation, bowel/rectal injury potentially necessitating colostomy, damage to the urinary tract resulting in urine leakage, urethral stricture, and the cardiopulmonary risks such as myocardial infarction, stroke, death, venothromboembolism, etc. were explained. The risk of open surgical conversion for robotic/laparoscopic prostatectomy was also discussed.   Ultimately, he is still deciding on his treatment choice. He also has a 2nd opinion at [facility] later this week and then plans to make a final decision on treatment. He appears to be leaning toward surgical therapy. This is at least in part due to his desire to avoid androgen deprivation therapy and his desire to resume testosterone  replacement therapy. He will notify me if I can be of assistance to him. My tentative plan would be to perform a bilateral nerve-sparing robot assisted laparoscopic radical prostatectomy and bilateral pelvic lymphadenectomy.   Cc: Dr. Azalia Scott  Dr. Irine Scott  Dr. Zola Button  Dr. Tyler Pita     E & M CODES: We spent 62 minutes dedicated to evaluation and management time, including face to face interaction, discussions on coordination of care, documentation, result review, and discussion with others as applicable.

## 2021-07-05 NOTE — Progress Notes (Signed)
Radiation Oncology         [phone] ________________________________  Multidisciplinary Prostate Cancer Clinic  Initial Radiation Oncology Consultation  Name: Richard Scott MRN: [id number]  Date: 07/05/2021  DOB: 11-30-1955  [id number], Richard Saxon, MD  Irine Seal, MD   REFERRING PHYSICIAN: Irine Seal, MD  DIAGNOSIS: 65 y.o. gentleman with stage T1c adenocarcinoma of the prostate with a Gleason's score of 4+3 and a PSA of 6.16    ICD-10-CM   1. Malignant neoplasm of prostate ([place])  Richard Scott is a 65 y.o. gentleman.  He was noted to have an elevated PSA of 5.01 by his primary care physician, Dr. Kenton Kingfisher. He also has a history of hypogonadism and had been on TRT for 3 years. Accordingly, he was referred for evaluation in urology by Dr. Jeffie Pollock on 02/23/21,  digital rectal examination was performed at that time revealing no nodules. Repeat PSA was obtained in 03/2021 showing further elevation to 8.29. The patient proceeded to transrectal ultrasound with 12 biopsies of the prostate on 05/23/21.  The prostate volume measured 40 cc.  Out of 12 core biopsies, 8 were positive.  The maximum Gleason score was 4+3, and this was seen in the right mid (with perineural invasion) and right base. Additionally, Gleason 3+4 was seen in the left base, left mid, left apex, right apex, and right base lateral (with PNI), and Gleason 3+3 in the left apex lateral.     His TRT was stopped at that time and he has had significant fatigue/decreased stamina since that time.  A repeat PSA on 06/22/2021 was 6.16.  He underwent staging CT A/P and bone scan on 06/10/21 which were both negative for metastatic disease.  The patient reviewed the biopsy results with his urologist and he has kindly been referred today to the multidisciplinary prostate cancer clinic for presentation of pathology and radiology studies in our conference for discussion of potential radiation treatment  options and clinical evaluation.  PREVIOUS RADIATION THERAPY: No  PAST MEDICAL HISTORY:  has a past medical history of ALLERGIC RHINITIS (03/05/2007), Anxiety, ATRIAL FIBRILLATION, PAROXYSMAL (04/28/2010), CALCULUS, KIDNEY (03/05/2007), CARDIOMYOPATHY (07/29/2010), Chest pain, CHEST PAIN UNSPECIFIED (10/01/2008), DEPRESSION (04/25/2007), ED (erectile dysfunction), HYPERLIPIDEMIA (03/05/2007), HYPERTENSION (10/01/2008), OTHER DRUG ALLERGY (01/18/2009), PALPITATIONS, HX OF (04/25/2007), Paresthesia of hand, SINUSITIS, ACUTE NOS (04/25/2007), and Tinnitus.    PAST SURGICAL HISTORY:No past surgical history on file.  FAMILY HISTORY: family history is not on file.  SOCIAL HISTORY:  reports that he has never smoked. He has never used smokeless tobacco.  ALLERGIES: Codeine, Darvon [propoxyphene], Demerol [meperidine hcl], Meperidine hcl, Morphine, Penicillins, Propoxyphene hcl, Sulfonamide derivatives, Tetanus toxoid, Tramadol, and Valsartan-hydrochlorothiazide  MEDICATIONS:  Current Outpatient Medications  Medication Sig Dispense Refill   amLODipine (NORVASC) 5 MG tablet Take 5 mg by mouth daily.     GLUCOSAMINE HCL PO Take by mouth.     irbesartan (AVAPRO) 300 MG tablet Take 300 mg by mouth daily.     loratadine (CLARITIN) 10 MG tablet Take 10 mg by mouth daily.     metroNIDAZOLE (METROGEL) 0.75 % gel Apply topically 2 (two) times daily as needed.     montelukast (SINGULAIR) 10 MG tablet Take 10 mg by mouth at bedtime.     Multiple Vitamin (MULTIVITAMIN) tablet Take 1 tablet by mouth daily.     simvastatin (ZOCOR) 40 MG tablet Take 1 tablet by mouth daily.  0   metoprolol succinate (TOPROL-XL) 100 MG 24 hr  tablet Take 150 mg by mouth daily.  (Patient not taking: Reported on 07/05/2021)  6   testosterone cypionate (DEPOTESTOSTERONE CYPIONATE) 200 MG/ML injection Inject 150 mg into the muscle every 14 (fourteen) days. (Patient not taking: Reported on 07/05/2021)     zaleplon (SONATA) 10 MG capsule Take 10 mg  by mouth at bedtime as needed for sleep. (Patient not taking: Reported on 07/05/2021)     No current facility-administered medications for this encounter.    REVIEW OF SYSTEMS:  On review of systems, the patient reports that he is doing well overall. He denies any chest pain, shortness of breath, cough, fevers, chills, night sweats, unintended weight changes. He denies any bowel disturbances, and denies abdominal pain, nausea or vomiting. He denies any new musculoskeletal or joint aches or pains. His IPSS was 6, indicating mild urinary symptoms. His SHIM was 15, indicating he has moderate erectile dysfunction. A complete review of systems is obtained and is otherwise negative.   PHYSICAL EXAM:  Wt Readings from Last 3 Encounters:  07/05/21 221 lb 3.2 oz (100.3 kg)  04/15/18 236 lb (107 kg)  04/11/18 243 lb (110.2 kg)   Temp Readings from Last 3 Encounters:  07/05/21 (!) 97.3 F (36.3 C)   BP Readings from Last 3 Encounters:  07/05/21 124/77  04/15/18 138/74  02/04/18 (!) 150/92   Pulse Readings from Last 3 Encounters:  07/05/21 77  04/15/18 76  02/04/18 83    /10  In general this is a well appearing Caucasian male in no acute distress. He's alert and oriented x4 and appropriate throughout the examination. Cardiopulmonary assessment is negative for acute distress and he exhibits normal effort.    KPS = 100  100 - Normal; no complaints; no evidence of disease. 90   - Able to carry on normal activity; minor signs or symptoms of disease. 80   - Normal activity with effort; some signs or symptoms of disease. 2   - Cares for self; unable to carry on normal activity or to do active work. 60   - Requires occasional assistance, but is able to care for most of his personal needs. 50   - Requires considerable assistance and frequent medical care. 46   - Disabled; requires special care and assistance. 32   - Severely disabled; hospital admission is indicated although death not  imminent. 18   - Very sick; hospital admission necessary; active supportive treatment necessary. 10   - Moribund; fatal processes progressing rapidly. 0     - Dead  Karnofsky DA, Abelmann [place], Craver LS and Burchenal [facility] [phone]) The use of the nitrogen mustards in the palliative treatment of carcinoma: with particular reference to bronchogenic carcinoma Cancer 1 [id number]   LABORATORY DATA:  Lab Results  Component Value Date   WBC 5.9 02/05/2012   HGB 16.9 02/05/2012   HCT 47.0 02/05/2012   MCV 85.6 02/05/2012   PLT 113 (L) 02/05/2012   Lab Results  Component Value Date   NA 139 11/09/2010   K 4.0 11/09/2010   CL 109 11/09/2010   CO2 24 11/08/2010   Lab Results  Component Value Date   ALT 24 10/06/2010   AST 29 10/06/2010   ALKPHOS 30 (L) 10/06/2010   BILITOT 2.2 (H) 10/06/2010     RADIOGRAPHY: No results found.    IMPRESSION/PLAN: 65 y.o. gentleman with Stage T1c adenocarcinoma of the prostate with a Gleason score of 4+3 and a PSA of 8.29.    We discussed the  patient's workup and outlined the nature of prostate cancer in this setting. The patient's T stage, Gleason's score, and PSA put him into the intermediate risk group. Accordingly, he is eligible for a variety of potential treatment options including brachytherapy, 5.5 weeks of external radiation, or prostatectomy. We discussed the available radiation techniques, and focused on the details and logistics of delivery. We discussed and outlined the risks, benefits, short and long-term effects associated with radiotherapy and compared and contrasted these with prostatectomy. We discussed the role of SpaceOAR gel in reducing the rectal toxicity associated with radiotherapy. He appears to have a good understanding of his disease and our treatment recommendations which are of curative intent.    The patient focused most of his questions and interest in robotic-assisted laparoscopic radical prostatectomy as he does not wish to go on ADT  and in fact, is interested in resuming TRT as soon as possible for improved quality of life.  We discussed some of the potential advantages of surgery including surgical staging, the availability of salvage radiotherapy to the prostatic fossa, and the confidence associated with immediate biochemical response.  We discussed some of the potential proven indications for postoperative radiotherapy including positive margins, extracapsular extension, and seminal vesicle involvement. We also talked about some of the other potential findings leading to a recommendation for radiotherapy including a non-zero postoperative PSA and positive lymph nodes. He was encouraged to ask questions that were answered to his stated satisfaction.  At the end of the conversation, the patient is interested in moving forward with prostatectomy. We enjoyed meeting with him today, and will look forward to following his progress.  He has our contact information and knows that he is welcome to call at anytime with any questions or concerns related to radiotherapy.   We personally spent 60 minutes in this encounter including chart review, reviewing radiological studies, meeting face-to-face with the patient, entering orders and completing documentation.    Nicholos Johns, PA-C    Tyler Pita, MD  [facility] Direct Dial: [phone]  Fax: [phone] [email or web address]  Skype  [email or web address]   This document serves as a record of services personally performed by Tyler Pita, MD and Freeman Caldron, PA-C. It was created on their behalf by Wilburn Mylar, a trained medical scribe. The creation of this record is based on the scribe's personal observations and the provider's statements to them. This document has been checked and approved by the attending provider.

## 2021-07-05 NOTE — Progress Notes (Signed)
                               Care Plan Summary  Name: Richard Scott DOB: [birth year]   Your Medical Team:   Urologist -  Dr. Raynelle Bring, [facility]  Radiation Oncologist - Dr. Tyler Pita, [facility]   Medical Oncologist - Dr. Zola Button, [place]  Recommendations: 1) Robotic prostatectomy   2) Radiation   * These recommendations are based on information available as of today's consult.      Recommendations may change depending on the results of further tests or exams.  Next Steps: 1) consider your options and call Cira Rue, RN    When appointments need to be scheduled, you will be contacted by [facility] and/or [facility].  Questions?  Please do not hesitate to call Cira Rue, RN, BSN, OCN at [phone] any questions or concerns.  Shirlean Mylar is your Oncology Nurse Navigator and is available to assist you while you're receiving your medical care at [facility].

## 2021-07-05 NOTE — Progress Notes (Signed)
Reason for the request:    Prostate cancer  HPI: I was asked by Dr. Jeffie Pollock to evaluate Richard Scott for the diagnosis of prostate cancer.  He is a 65 year old man with history of follow-up hypertension, hyperlipidemia as well as history of thrombocytopenia after septic episode in 2011.  He is also on testosterone replacement as well.  He was found to have elevated PSA up to 6.16 and underwent prostate biopsy by Dr. Jeffie Pollock in July 2022.  At that time he was found to have a Gleason score 4+3 = 7 and 2 cores with 5 other cores of 3+4 = 7.  Staging work-up at that time did not show any evidence of metastatic disease.  Clinically, there is feel overall decreased libido and significant fatigue since he stopped his testosterone replacement of also felt of chronic burning sensation in his prostate and testicles.  Despite that, he remains active continues to attempt activities of daily living.  He denies any specific urinary symptoms although does report occasional frequency but no urgency or nocturia.  He denies any bone pain or pathological fractures.  He does not report any headaches, blurry vision, syncope or seizures. Does not report any fevers, chills or sweats.  Does not report any cough, wheezing or hemoptysis.  Does not report any chest pain, palpitation, orthopnea or leg edema.  Does not report any nausea, vomiting or abdominal pain.  Does not report any constipation or diarrhea.  Does not report any skeletal complaints.    Does not report frequency, urgency or hematuria.  Does not report any skin rashes or lesions. Does not report any heat or cold intolerance.  Does not report any lymphadenopathy or petechiae.  Does not report any anxiety or depression.  Remaining review of systems is negative.     Past Medical History:  Diagnosis Date   ALLERGIC RHINITIS 03/05/2007   Qualifier: Diagnosis of  By: Jerold Coombe     Anxiety    ATRIAL FIBRILLATION, PAROXYSMAL 04/28/2010   Qualifier: Diagnosis of  By:  Ron Parker, MD, [facility], [place], KIDNEY 03/05/2007   Qualifier: Diagnosis of  By: Lowne DO, [place] 07/29/2010   Qualifier: Diagnosis of  By: Rose Fillers RN, [place]     Chest pain    CHEST PAIN UNSPECIFIED 10/01/2008   Qualifier: Diagnosis of  By: Jerold Coombe     DEPRESSION 04/25/2007   Qualifier: Diagnosis of  By: Jerold Coombe     ED (erectile dysfunction)    HYPERLIPIDEMIA 03/05/2007   Qualifier: Diagnosis of  By: Jerold Coombe     HYPERTENSION 10/01/2008   Qualifier: Diagnosis of  By: Jerold Coombe     OTHER DRUG ALLERGY 01/18/2009   Qualifier: Diagnosis of  By: Jerold Coombe     PALPITATIONS, HX OF 04/25/2007   Qualifier: Diagnosis of  By: Jerold Coombe     Paresthesia of hand    SINUSITIS, ACUTE NOS 04/25/2007   Qualifier: Diagnosis of  By: Jerold Coombe     Tinnitus   :  No past surgical history on file.:   Current Outpatient Medications:    amLODipine (NORVASC) 5 MG tablet, Take 5 mg by mouth daily., Disp: , Rfl:    GLUCOSAMINE HCL PO, Take by mouth., Disp: , Rfl:    irbesartan (AVAPRO) 300 MG tablet, Take 300 mg by mouth daily., Disp: , Rfl:    loratadine (CLARITIN) 10 MG tablet, Take 10 mg  by mouth daily., Disp: , Rfl:    metoprolol succinate (TOPROL-XL) 100 MG 24 hr tablet, Take 150 mg by mouth daily.  (Patient not taking: Reported on 07/05/2021), Disp: , Rfl: 6   metroNIDAZOLE (METROGEL) 0.75 % gel, Apply topically 2 (two) times daily as needed., Disp: , Rfl:    montelukast (SINGULAIR) 10 MG tablet, Take 10 mg by mouth at bedtime., Disp: , Rfl:    Multiple Vitamin (MULTIVITAMIN) tablet, Take 1 tablet by mouth daily., Disp: , Rfl:    simvastatin (ZOCOR) 40 MG tablet, Take 1 tablet by mouth daily., Disp: , Rfl: 0   testosterone cypionate (DEPOTESTOSTERONE CYPIONATE) 200 MG/ML injection, Inject 150 mg into the muscle every 14 (fourteen) days. (Patient not taking: Reported on 07/05/2021), Disp: , Rfl:    zaleplon (SONATA) 10 MG  capsule, Take 10 mg by mouth at bedtime as needed for sleep. (Patient not taking: Reported on 07/05/2021), Disp: , Rfl: :   Allergies  Allergen Reactions   Codeine    Darvon [Propoxyphene]    Demerol [Meperidine Hcl]    Meperidine Hcl    Morphine    Penicillins    Propoxyphene Hcl    Sulfonamide Derivatives    Tetanus Toxoid    Tramadol    Valsartan-Hydrochlorothiazide   :  No family history on file.:   Social History   Socioeconomic History   Marital status: Married    Spouse name: Not on file   Number of children: Not on file   Years of education: Not on file   Highest education level: Not on file  Occupational History   Not on file  Tobacco Use   Smoking status: Never   Smokeless tobacco: Never  Substance and Sexual Activity   Alcohol use: Not on file   Drug use: Not on file   Sexual activity: Not on file  Other Topics Concern   Not on file  Social History Narrative   Not on file   Social Determinants of Health   Financial Resource Strain: Not on file  Food Insecurity: Not on file  Transportation Needs: Not on file  Physical Activity: Not on file  Stress: Not on file  Social Connections: Not on file  Intimate Partner Violence: Not on file  :  Pertinent items are noted in HPI.  Exam: ECOG 0 General appearance: alert and cooperative appeared without distress. Head: atraumatic without any abnormalities. Eyes: conjunctivae/corneas clear. PERRL.  Sclera anicteric. Throat: lips, mucosa, and tongue normal; without oral thrush or ulcers. Resp: clear to auscultation bilaterally without rhonchi, wheezes or dullness to percussion. Cardio: regular rate and rhythm, S1, S2 normal, no murmur, click, rub or gallop GI: soft, non-tender; bowel sounds normal; no masses,  no organomegaly Skin: Skin color, texture, turgor normal. No rashes or lesions Lymph nodes: Cervical, supraclavicular, and axillary nodes normal. Neurologic: Grossly normal without any motor, sensory  or deep tendon reflexes. Musculoskeletal: No joint deformity or effusion.   NM Bone Scan Whole Body  Result Date: 06/13/2021 CLINICAL DATA:  Prostate cancer.  Elevated PSA. EXAM: NUCLEAR MEDICINE WHOLE BODY BONE SCAN TECHNIQUE: Whole body anterior and posterior images were obtained approximately 3 hours after intravenous injection of radiopharmaceutical. RADIOPHARMACEUTICALS:  19.4 mCi Technetium-59mMDP IV COMPARISON:  CT abdomen 01/16/2017. FINDINGS: No abnormal skeletal activity in the pelvis. Focal activity in the lower lumbar spine on the left at L4-5, most usually indicating facet osteoarthritis. Minimal arthritic changes at the shoulders, knees, feet and ankles. IMPRESSION: No findings strongly suggestive of  osseous metastatic disease from prostate cancer. Some focal activity in the lower lumbar spine to the left of midline, probably associated with L4-5 facet osteoarthritis. If greater certainty is desired, lumbar MRI could be considered. My suspicion is low. Electronically Signed   By: Nelson Chimes M.D.   On: 06/13/2021 10:47    Assessment and Plan:    65 year old with prostate cancer diagnosed in July 2022.  He was found to have Gleason score 4+3 =7 in at least 2 cores.  His PSA was 6.16 without any evidence of metastatic disease.  His case was discussed today in the prostate cancer multidisciplinary clinic including review of his pathology results with the reviewing pathologist as well as reviewing imaging studies with radiology.  Treatment options were reviewed including primary surgical therapy versus radiation and short-term androgen deprivation therapy.  Risks and benefits of both approaches were discussed at this time and given his symptoms of pain as well as fatigue and low testosterone and review deprivation therapy I be problematic.  Primary surgical therapy may be a more reasonable option at this time which she is considering.  Role for additional systemic therapy including androgen  receptor pathway inhibitors, systemic chemotherapy among other options will be deferred unless he has advanced.  All his questions were answered today to his satisfaction.  45  minutes were dedicated to this visit. The time was spent on reviewing laboratory data, imaging studies, discussing treatment options, and answering questions regarding future plan.    A copy of this consult has been forwarded to the requesting physician.

## 2021-07-07 ENCOUNTER — Encounter: Payer: Self-pay | Admitting: General Practice

## 2021-07-07 NOTE — Progress Notes (Signed)
[facility] Psychosocial Distress Screening Spiritual Care  Met with Richard Scott by phone following Prostate Multidisciplinary Clinic to introduce [place] team/resources, reviewing distress screen per protocol.  The patient scored a 4 on the Psychosocial Distress Thermometer which indicates moderate distress. Also assessed for distress and other psychosocial needs.   ONCBCN DISTRESS SCREENING 07/07/2021  Screening Type Initial Screening  Distress experienced in past week (1-10) 4  Emotional problem type Nervousness/Anxiety;Adjusting to illness  Spiritual/Religous concerns type Facing my mortality  Physical Problem type Sexual problems  Referral to support programs Yes   [name] ("can-IPE") notes that he is someone who "does not like uncertainty" and prefers to take action. He anticipates that selecting a team and treatment plan will reduce his anxiety. 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  Provided empathic listening, normalization of feelings, introduction to support programming, and birthday wishes.   Follow up needed: No. [name] knows to reach out as needed/desired.   [place], [place], [facility] Pager [phone] Voicemail [phone]

## 2023-12-18 DIAGNOSIS — M1811 Unilateral primary osteoarthritis of first carpometacarpal joint, right hand: Secondary | ICD-10-CM | POA: Diagnosis not present

## 2023-12-20 DIAGNOSIS — E7800 Pure hypercholesterolemia, unspecified: Secondary | ICD-10-CM | POA: Diagnosis not present

## 2023-12-20 DIAGNOSIS — N5239 Other post-surgical erectile dysfunction: Secondary | ICD-10-CM | POA: Diagnosis not present

## 2023-12-20 DIAGNOSIS — I10 Essential (primary) hypertension: Secondary | ICD-10-CM | POA: Diagnosis not present

## 2023-12-20 DIAGNOSIS — J301 Allergic rhinitis due to pollen: Secondary | ICD-10-CM | POA: Diagnosis not present

## 2023-12-20 DIAGNOSIS — F324 Major depressive disorder, single episode, in partial remission: Secondary | ICD-10-CM | POA: Diagnosis not present

## 2023-12-20 DIAGNOSIS — F5101 Primary insomnia: Secondary | ICD-10-CM | POA: Diagnosis not present

## 2024-01-17 DIAGNOSIS — L821 Other seborrheic keratosis: Secondary | ICD-10-CM | POA: Diagnosis not present

## 2024-01-17 DIAGNOSIS — L918 Other hypertrophic disorders of the skin: Secondary | ICD-10-CM | POA: Diagnosis not present

## 2024-01-17 DIAGNOSIS — D1801 Hemangioma of skin and subcutaneous tissue: Secondary | ICD-10-CM | POA: Diagnosis not present

## 2024-01-17 DIAGNOSIS — L853 Xerosis cutis: Secondary | ICD-10-CM | POA: Diagnosis not present

## 2024-01-24 DIAGNOSIS — C61 Malignant neoplasm of prostate: Secondary | ICD-10-CM | POA: Diagnosis not present

## 2024-02-12 DIAGNOSIS — Z1211 Encounter for screening for malignant neoplasm of colon: Secondary | ICD-10-CM | POA: Diagnosis not present

## 2024-04-24 DIAGNOSIS — C61 Malignant neoplasm of prostate: Secondary | ICD-10-CM | POA: Diagnosis not present

## 2024-04-24 DIAGNOSIS — D691 Qualitative platelet defects: Secondary | ICD-10-CM | POA: Diagnosis not present

## 2024-04-24 DIAGNOSIS — R413 Other amnesia: Secondary | ICD-10-CM | POA: Diagnosis not present

## 2024-06-30 DIAGNOSIS — I10 Essential (primary) hypertension: Secondary | ICD-10-CM | POA: Diagnosis not present

## 2024-06-30 DIAGNOSIS — N5239 Other post-surgical erectile dysfunction: Secondary | ICD-10-CM | POA: Diagnosis not present

## 2024-06-30 DIAGNOSIS — F324 Major depressive disorder, single episode, in partial remission: Secondary | ICD-10-CM | POA: Diagnosis not present

## 2024-06-30 DIAGNOSIS — K219 Gastro-esophageal reflux disease without esophagitis: Secondary | ICD-10-CM | POA: Diagnosis not present

## 2024-06-30 DIAGNOSIS — R413 Other amnesia: Secondary | ICD-10-CM | POA: Diagnosis not present

## 2024-06-30 DIAGNOSIS — E7800 Pure hypercholesterolemia, unspecified: Secondary | ICD-10-CM | POA: Diagnosis not present

## 2024-06-30 DIAGNOSIS — J301 Allergic rhinitis due to pollen: Secondary | ICD-10-CM | POA: Diagnosis not present

## 2024-06-30 DIAGNOSIS — F5101 Primary insomnia: Secondary | ICD-10-CM | POA: Diagnosis not present

## 2024-07-03 ENCOUNTER — Other Ambulatory Visit: Payer: Self-pay | Admitting: Family Medicine

## 2024-07-03 DIAGNOSIS — R413 Other amnesia: Secondary | ICD-10-CM

## 2024-07-08 ENCOUNTER — Encounter: Payer: Self-pay | Admitting: Family Medicine

## 2024-07-09 ENCOUNTER — Ambulatory Visit
Admission: RE | Admit: 2024-07-09 | Discharge: 2024-07-09 | Disposition: A | Discharge status: Home / Self Care | Source: Ambulatory Visit | Attending: Family Medicine | Admitting: Family Medicine

## 2024-07-09 DIAGNOSIS — R413 Other amnesia: Secondary | ICD-10-CM | POA: Diagnosis not present

## 2024-07-24 DIAGNOSIS — E895 Postprocedural testicular hypofunction: Secondary | ICD-10-CM | POA: Diagnosis not present

## 2024-07-24 DIAGNOSIS — C61 Malignant neoplasm of prostate: Secondary | ICD-10-CM | POA: Diagnosis not present

## 2024-09-22 DIAGNOSIS — F324 Major depressive disorder, single episode, in partial remission: Secondary | ICD-10-CM | POA: Diagnosis not present

## 2024-09-22 DIAGNOSIS — K219 Gastro-esophageal reflux disease without esophagitis: Secondary | ICD-10-CM | POA: Diagnosis not present

## 2024-09-22 DIAGNOSIS — E7800 Pure hypercholesterolemia, unspecified: Secondary | ICD-10-CM | POA: Diagnosis not present

## 2024-09-22 DIAGNOSIS — R413 Other amnesia: Secondary | ICD-10-CM | POA: Diagnosis not present

## 2024-09-22 DIAGNOSIS — I10 Essential (primary) hypertension: Secondary | ICD-10-CM | POA: Diagnosis not present

## 2024-09-22 DIAGNOSIS — J301 Allergic rhinitis due to pollen: Secondary | ICD-10-CM | POA: Diagnosis not present

## 2024-09-22 DIAGNOSIS — Z0000 Encounter for general adult medical examination without abnormal findings: Secondary | ICD-10-CM | POA: Diagnosis not present

## 2024-09-22 DIAGNOSIS — N5239 Other post-surgical erectile dysfunction: Secondary | ICD-10-CM | POA: Diagnosis not present

## 2024-09-22 DIAGNOSIS — F5101 Primary insomnia: Secondary | ICD-10-CM | POA: Diagnosis not present

## 2024-10-17 DIAGNOSIS — Z1331 Encounter for screening for depression: Secondary | ICD-10-CM | POA: Diagnosis not present

## 2024-10-17 DIAGNOSIS — C61 Malignant neoplasm of prostate: Secondary | ICD-10-CM | POA: Diagnosis not present

## 2024-10-17 DIAGNOSIS — R4189 Other symptoms and signs involving cognitive functions and awareness: Secondary | ICD-10-CM | POA: Diagnosis not present

## 2024-11-24 ENCOUNTER — Other Ambulatory Visit: Payer: Self-pay | Admitting: Family Medicine

## 2024-11-24 DIAGNOSIS — M5412 Radiculopathy, cervical region: Secondary | ICD-10-CM

## 2024-12-07 ENCOUNTER — Other Ambulatory Visit

## 2024-12-20 ENCOUNTER — Other Ambulatory Visit
